# Patient Record
Sex: Female | Born: 1990
Health system: Southern US, Community
[De-identification: ages and names within clinical notes are randomized; demographics above are authoritative.]

## PROBLEM LIST (undated history)

## (undated) DIAGNOSIS — B3731 Acute candidiasis of vulva and vagina: Secondary | ICD-10-CM

## (undated) DIAGNOSIS — B9689 Other specified bacterial agents as the cause of diseases classified elsewhere: Secondary | ICD-10-CM

## (undated) DIAGNOSIS — K589 Irritable bowel syndrome without diarrhea: Secondary | ICD-10-CM

## (undated) DIAGNOSIS — B373 Candidiasis of vulva and vagina: Secondary | ICD-10-CM

## (undated) DIAGNOSIS — N760 Acute vaginitis: Secondary | ICD-10-CM

## (undated) DIAGNOSIS — Z227 Latent tuberculosis: Secondary | ICD-10-CM

## (undated) DIAGNOSIS — M419 Scoliosis, unspecified: Secondary | ICD-10-CM

## (undated) HISTORY — PX: FOOT SURGERY: SHX648

## (undated) HISTORY — DX: Irritable bowel syndrome, unspecified: K58.9

---
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
## 2010-11-09 IMAGING — CR DG CHEST 2V
2 series · 2 of 2 positions shown · non-contrast
Comparison: None

CLINICAL DATA: Nonsmoker.  Positive PPD test.

CHEST - 2 VIEW

[view not recorded (1 of 2)]
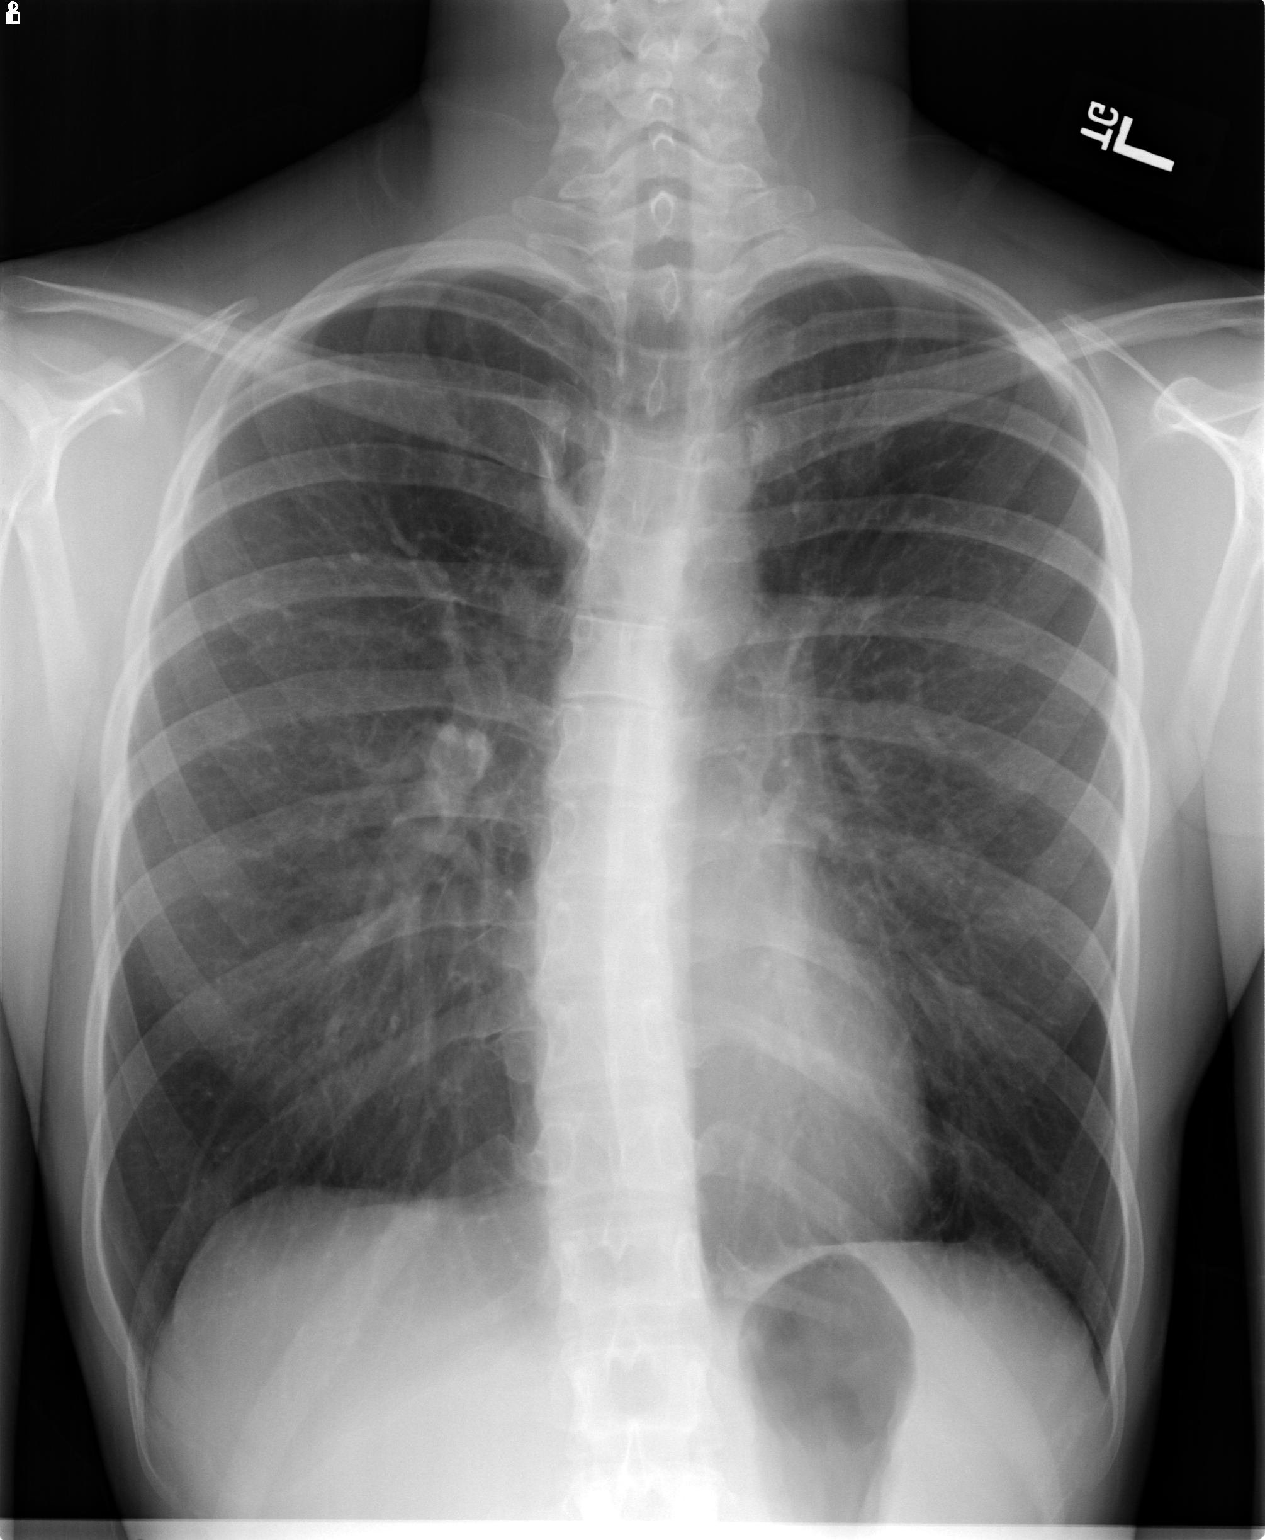

[view not recorded (2 of 2)]
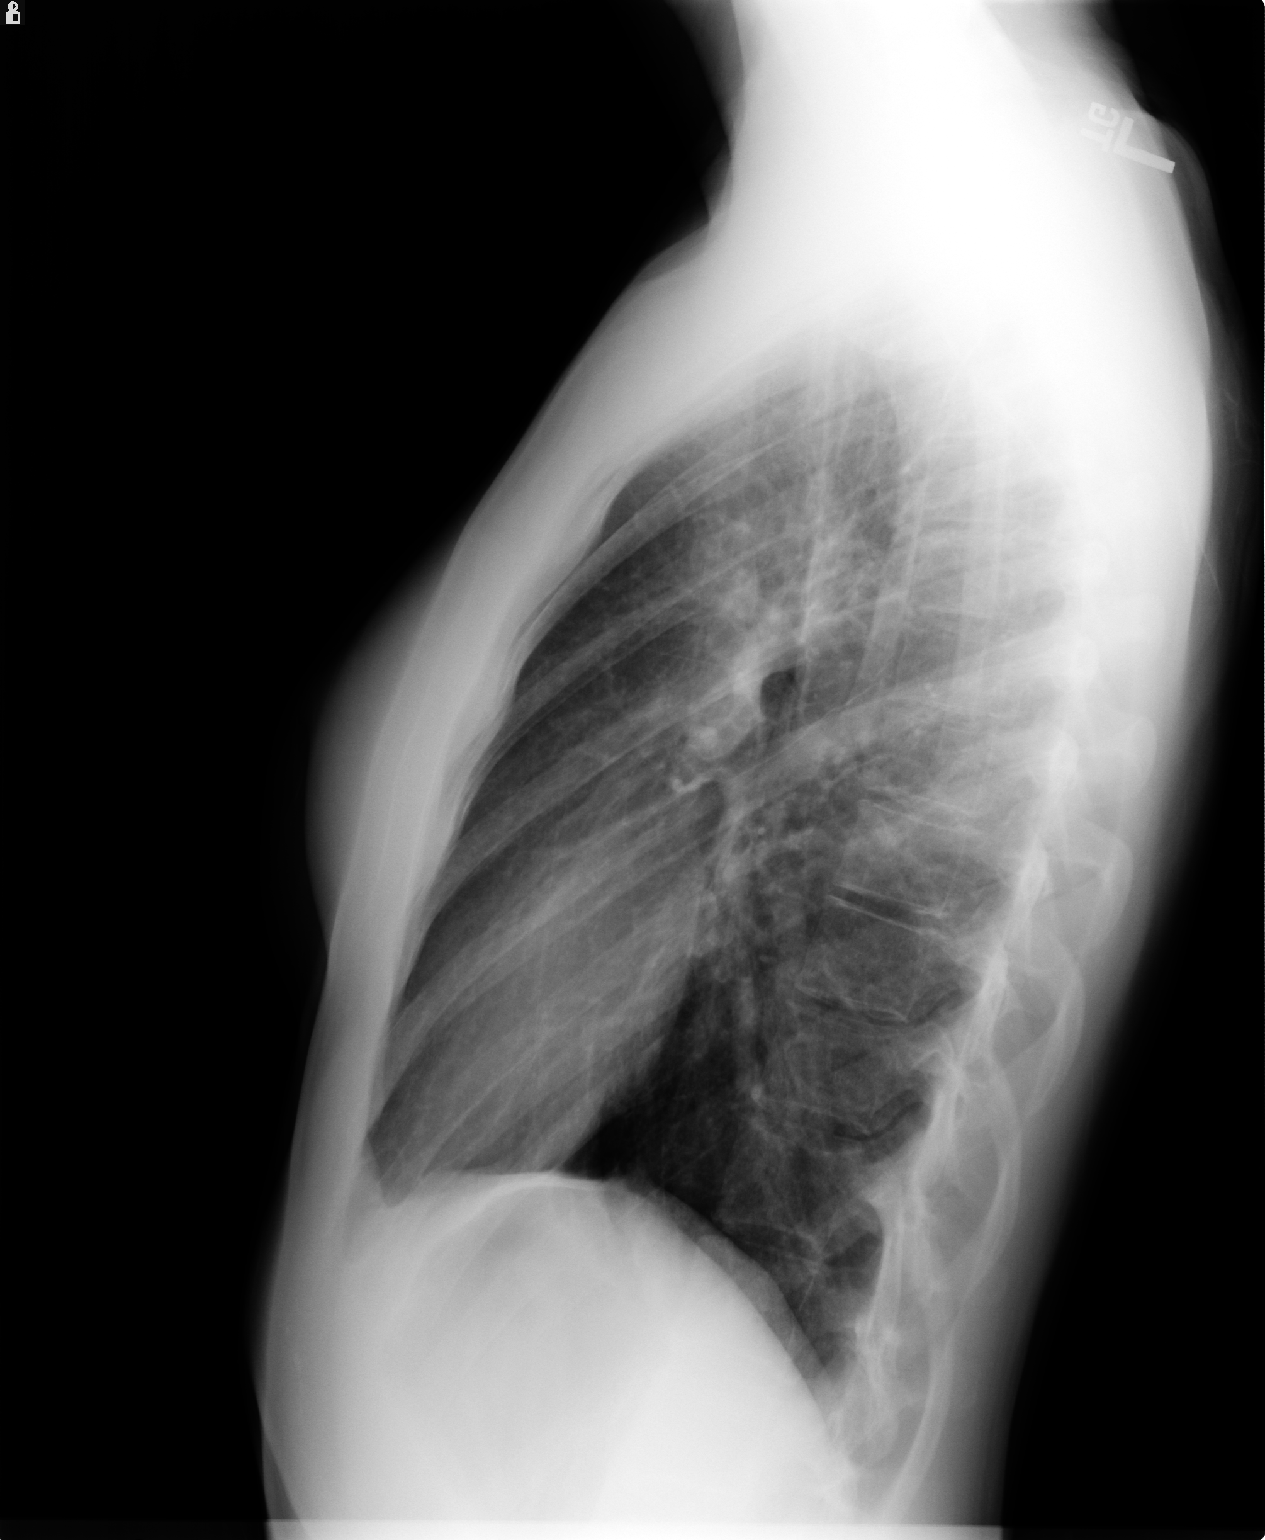

[2 of 2 positions shown; findings below may reference images not displayed]

FINDINGS: Mild to moderate S-shaped thoracolumbar spinal curvature.
Thoracic portion convex right. Midline trachea. Normal heart size
and mediastinal contours. No pleural effusion or pneumothorax.
Azygos fissure which is a normal anatomic variant. Clear lungs.
IMPRESSION: 1. No acute cardiopulmonary disease.
2.  Mild - moderate S-shaped thoracolumbar spinal curvature.

## 2010-12-19 LAB — LIPASE, BLOOD: Lipase: 23 U/L (ref 11–59)

## 2010-12-19 LAB — DIFFERENTIAL
Basophils Relative: 0 % (ref 0–1)
Eosinophils Absolute: 0.1 10*3/uL (ref 0.0–0.7)
Neutrophils Relative %: 56 % (ref 43–77)

## 2010-12-19 LAB — COMPREHENSIVE METABOLIC PANEL
ALT: 9 U/L (ref 0–35)
Alkaline Phosphatase: 47 U/L (ref 39–117)
CO2: 26 mEq/L (ref 19–32)
GFR calc non Af Amer: >60 mL/min (ref >60)
Glucose, Bld: 89 mg/dL (ref 70–99)
Potassium: 3.4 mEq/L — ABNORMAL LOW (ref 3.5–5.1)
Sodium: 137 mEq/L (ref 135–145)

## 2010-12-19 LAB — URINALYSIS, ROUTINE W REFLEX MICROSCOPIC
Bilirubin Urine: NEGATIVE
Glucose, UA: NEGATIVE mg/dL
Hgb urine dipstick: NEGATIVE
Ketones, ur: NEGATIVE mg/dL
Nitrite: NEGATIVE
Protein, ur: NEGATIVE mg/dL
Specific Gravity, Urine: 1.013 (ref 1.005–1.030)
Urobilinogen, UA: 0.2 mg/dL (ref 0.0–1.0)
pH: 7.5 (ref 5.0–8.0)

## 2010-12-19 LAB — CBC
HCT: 36.2 % (ref 36.0–46.0)
Hemoglobin: 12.9 g/dL (ref 12.0–15.0)
MCHC: 35.8 g/dL (ref 30.0–36.0)
MCV: 89.1 fL (ref 78.0–100.0)
Platelets: 200 10*3/uL (ref 150–400)
RBC: 4.06 MIL/uL (ref 3.87–5.11)
RDW: 12 % (ref 11.5–15.5)
WBC: 6.1 10*3/uL (ref 4.0–10.5)

## 2010-12-19 LAB — POCT PREGNANCY, URINE: Preg Test, Ur: NEGATIVE

## 2011-08-06 ENCOUNTER — Emergency Department (INDEPENDENT_AMBULATORY_CARE_PROVIDER_SITE_OTHER)
Admission: EM | Admit: 2011-08-06 | Discharge: 2011-08-06 | Disposition: A | Discharge status: Home / Self Care | Payer: Self-pay | Source: Home / Self Care | Attending: Emergency Medicine | Admitting: Emergency Medicine

## 2011-08-06 DIAGNOSIS — B373 Candidiasis of vulva and vagina: Secondary | ICD-10-CM

## 2011-08-06 DIAGNOSIS — R300 Dysuria: Secondary | ICD-10-CM

## 2011-08-06 DIAGNOSIS — B3731 Acute candidiasis of vulva and vagina: Secondary | ICD-10-CM

## 2011-08-06 HISTORY — DX: Candidiasis of vulva and vagina: B37.3

## 2011-08-06 HISTORY — DX: Acute candidiasis of vulva and vagina: B37.31

## 2011-08-06 LAB — POCT URINALYSIS DIP (DEVICE)
Glucose, UA: NEGATIVE mg/dL
Nitrite: NEGATIVE
Urobilinogen, UA: 0.2 mg/dL (ref 0.0–1.0)

## 2011-08-06 LAB — WET PREP, GENITAL: Yeast Wet Prep HPF POC: NONE SEEN

## 2011-08-06 LAB — POCT PREGNANCY, URINE: Preg Test, Ur: NEGATIVE

## 2011-08-06 MED ORDER — FLUCONAZOLE 150 MG PO TABS: ORAL_TABLET | ORAL | Status: AC

## 2011-08-06 NOTE — ED Provider Notes (Signed)
History     CSN: [id number] Arrival date & time: 08/06/2011 12:42 PM   First MD Initiated Contact with Patient 08/06/11 1214      Chief Complaint  Patient presents with  . Urinary Tract Infection    (Consider location/radiation/quality/duration/timing/severity/associated sxs/prior treatment) HPI Comments: Pt with 3 days nonoderous vaginal discharge, dysuria at end of stream. Was having some itching but used montistat vaginal suppositories with relief in itching. Dysuria started today.  No urgency, frequency, oderous urine, hematuria,  genital blisters. No fevers, N/V, abd pain, back pain. No recent abx use. Pt sexually active with same female partner who is asxatic. Had unprotected intercourse a week ago.  STD's not a concern today. H/o yeast infection. No h/o BV gonorrhea/chlamydia/trichmonoas. No h/o syphilis, herpes, HIV.    Patient is a 20 y.o. female presenting with dysuria. The history is provided by the patient.  Dysuria  This is a new problem. The current episode started more than 2 days ago. The problem occurs every urination. The problem has not changed since onset.The quality of the pain is described as burning. There has been no fever. There is no history of pyelonephritis. Associated symptoms include discharge. Pertinent negatives include no chills, no sweats, no nausea, no vomiting, no frequency, no hematuria, no hesitancy, no urgency and no flank pain.    Past Medical History  Diagnosis Date  . Vaginal yeast infection     History reviewed. No pertinent past surgical history.  History reviewed. No pertinent family history.  History  Substance Use Topics  . Smoking status: Never Smoker   . Smokeless tobacco: Not on file  . Alcohol Use: Yes    OB History    Grav Para Term Preterm Abortions TAB SAB Ect Mult Living                  Review of Systems  Constitutional: Negative for fever and chills.  Gastrointestinal: Negative for nausea, vomiting and abdominal  pain.  Genitourinary: Positive for dysuria and vaginal discharge. Negative for hesitancy, urgency, frequency, hematuria, flank pain, vaginal bleeding, genital sores and vaginal pain.  Musculoskeletal: Positive for back pain.  Skin: Negative for rash.    Allergies  Amoxapine and related; Penicillins; and Septra  Home Medications   Current Outpatient Rx  Name Route Sig Dispense Refill  . FLUCONAZOLE 150 MG PO TABS  1 tab po x 1. May repeat in 72 hours if no improvement 2 tablet 0    BP 104/56  Pulse 70  Temp(Src) 98.1 F (36.7 C) (Oral)  Resp 16  SpO2 100%  LMP 07/17/2011  Physical Exam  Nursing note and vitals reviewed. Constitutional: She is oriented to person, place, and time. She appears well-developed and well-nourished. No distress.  HENT:  Head: Normocephalic and atraumatic.  Eyes: EOM are normal. Pupils are equal, round, and reactive to light.  Neck: Normal range of motion.  Cardiovascular: Normal rate, regular rhythm and normal heart sounds.   Pulmonary/Chest: Effort normal and breath sounds normal.  Abdominal: Soft. Bowel sounds are normal. She exhibits no distension. There is no tenderness. There is no rebound, no guarding and no CVA tenderness.  Genitourinary: Uterus normal. Pelvic exam was performed with patient prone. There is no rash on the right labia. There is no rash on the left labia. Uterus is not tender. Cervix exhibits discharge. Cervix exhibits no motion tenderness and no friability. Right adnexum displays no mass, no tenderness and no fullness. Left adnexum displays no mass, no tenderness and  no fullness. There is erythema around the vagina. No tenderness or bleeding around the vagina. No foreign body around the vagina. Vaginal discharge found.       externeal vulvar irritation. Thick white nonoderous  vaginal d/c. Red inflamed cervix. Chaperone present during exam  Musculoskeletal: Normal range of motion.  Neurological: She is alert and oriented to person,  place, and time.  Skin: Skin is warm and dry.  Psychiatric: She has a normal mood and affect. Her behavior is normal. Judgment and thought content normal.    ED Course  Procedures (including critical care time)  Labs Reviewed  POCT URINALYSIS DIP (DEVICE) - Abnormal; Notable for the following:    Hgb urine dipstick SMALL (*)    Protein, ur 100 (*)    Leukocytes, UA SMALL (*) Biochemical Testing Only. Please order routine urinalysis from main lab if confirmatory testing is needed.   All other components within normal limits  POCT PREGNANCY, URINE  POCT PREGNANCY, URINE  WET PREP, GENITAL  GC/CHLAMYDIA PROBE AMP, GENITAL   No results found.   1. Vaginal yeast infection   2. Dysuria       MDM  H&P most c/w yeast infection. Sent off GC/chlamydia, wet prep.  Will not treat empirically now pt willing to wait for results. Will send home with diflucan. Advised pt to refrain from sexual contact until she knows lab results, symptoms resolve, and partner(s) are treated. Pt provided working phone number. Advised to call here in 5 days if pt does not hear from [place]. Pt agrees.      Luiz Blare, MD 08/06/11 [phone]

## 2011-08-06 NOTE — ED Notes (Signed)
C/o has pain w urination, worse at end of UA stream; also c/o vaginal d/c  Like cottage cheese ; used OTC vaginal treatment, w little relief; ?STD

## 2011-08-07 LAB — GC/CHLAMYDIA PROBE AMP, GENITAL
Chlamydia, DNA Probe: NEGATIVE
GC Probe Amp, Genital: NEGATIVE

## 2011-08-08 ENCOUNTER — Emergency Department (HOSPITAL_COMMUNITY)
Admission: EM | Admit: 2011-08-08 | Discharge: 2011-08-09 | Disposition: A | Discharge status: Home / Self Care | Payer: Self-pay | Attending: Emergency Medicine | Admitting: Emergency Medicine

## 2011-08-08 ENCOUNTER — Encounter (HOSPITAL_COMMUNITY): Payer: Self-pay | Admitting: *Deleted

## 2011-08-08 ENCOUNTER — Telehealth (HOSPITAL_COMMUNITY): Payer: Self-pay | Admitting: *Deleted

## 2011-08-08 DIAGNOSIS — B9689 Other specified bacterial agents as the cause of diseases classified elsewhere: Secondary | ICD-10-CM | POA: Insufficient documentation

## 2011-08-08 DIAGNOSIS — R300 Dysuria: Secondary | ICD-10-CM | POA: Insufficient documentation

## 2011-08-08 DIAGNOSIS — N760 Acute vaginitis: Secondary | ICD-10-CM | POA: Insufficient documentation

## 2011-08-08 DIAGNOSIS — A499 Bacterial infection, unspecified: Secondary | ICD-10-CM | POA: Insufficient documentation

## 2011-08-08 DIAGNOSIS — R10819 Abdominal tenderness, unspecified site: Secondary | ICD-10-CM | POA: Insufficient documentation

## 2011-08-08 HISTORY — DX: Other specified bacterial agents as the cause of diseases classified elsewhere: N76.0

## 2011-08-08 HISTORY — DX: Other specified bacterial agents as the cause of diseases classified elsewhere: B96.89

## 2011-08-08 NOTE — ED Notes (Signed)
Order obtained from  Dr. Chaney Malling for Flagyl 500 mg. BID x 7 days. Vassie Moselle 08/08/2011

## 2011-08-08 NOTE — ED Notes (Signed)
Pt c/o dysuria and hematuria w/ wiping after urination.  Pt d/x w/ BV and rx'd flagyl but has not started taking it yet. Pt c/o low back pain starting tonight.

## 2011-08-09 LAB — URINALYSIS, ROUTINE W REFLEX MICROSCOPIC
Specific Gravity, Urine: 1.01 (ref 1.005–1.030)
Urobilinogen, UA: 0.2 mg/dL (ref 0.0–1.0)

## 2011-08-09 LAB — URINE MICROSCOPIC-ADD ON

## 2011-08-09 LAB — POCT PREGNANCY, URINE: Preg Test, Ur: NEGATIVE

## 2011-08-09 MED ORDER — IBUPROFEN 800 MG PO TABS
800.0000 mg | ORAL_TABLET | Freq: Once | ORAL | Status: AC
  Administered 2011-08-09: 800 mg via ORAL
  Filled 2011-08-09: qty 1

## 2011-08-09 MED ORDER — OXYCODONE-ACETAMINOPHEN 5-325 MG PO TABS: 2.0000 | ORAL_TABLET | ORAL | Status: DC | PRN

## 2011-08-09 NOTE — ED Notes (Signed)
Lt lower back pain scale 5/10, medicated with motrin 800 mg po as ordered

## 2011-08-09 NOTE — ED Provider Notes (Signed)
History     CSN: [id number] Arrival date & time: 08/08/2011 11:10 PM   First MD Initiated Contact with Patient 08/08/11 2346      Chief Complaint  Patient presents with  . Hematuria  . Dysuria    (Consider location/radiation/quality/duration/timing/severity/associated sxs/prior treatment) Patient is a 20 y.o. female presenting with hematuria and dysuria. The history is provided by the patient. No language interpreter was used.  Hematuria This is a recurrent problem. The current episode started in the past 7 days. The problem is unchanged. Her pain is at a severity of 4/10. The pain is mild. She describes her urine color as light pink. Irritative symptoms do not include frequency or urgency. Obstructive symptoms do not include dribbling or a weak stream. Associated symptoms include dysuria and flank pain. Pertinent negatives include no abdominal pain, chills, fever, genital pain, hesitancy, inability to urinate, nausea or vomiting. There is no history of BPH, GU trauma, kidney stones, prostatitis, recent infection, sickle cell disease, STDs or tobacco use.  Dysuria  Associated symptoms include hematuria and flank pain. Pertinent negatives include no chills, no nausea, no vomiting, no frequency, no hesitancy and no urgency. Her past medical history does not include kidney stones.  Reports vaginal discharge x 3-4 days and was called today with a positive BV lab result from Urgent care when she was seen the day before.  She has not started her flagyl yet.  Now c/o dysuria and she saw some blood on the paper after wiping.  Also having L cva tenderness.  Has taken nothing for pain.  Does not like to take pain meds.    Past Medical History  Diagnosis Date  . Vaginal yeast infection   . BV (bacterial vaginosis)     History reviewed. No pertinent past surgical history.  History reviewed. No pertinent family history.  History  Substance Use Topics  . Smoking status: Current Everyday Smoker  -- 0.5 packs/day    Types: Cigarettes  . Smokeless tobacco: Not on file  . Alcohol Use: Yes    OB History    Grav Para Term Preterm Abortions TAB SAB Ect Mult Living                  Review of Systems  Constitutional: Negative for fever and chills.  Gastrointestinal: Negative for nausea, vomiting and abdominal pain.  Genitourinary: Positive for dysuria, hematuria and flank pain. Negative for hesitancy, urgency and frequency.  All other systems reviewed and are negative.    Allergies  Amoxapine and related; Penicillins; and Septra  Home Medications   Current Outpatient Rx  Name Route Sig Dispense Refill  . METRONIDAZOLE 500 MG PO TABS Oral Take 500 mg by mouth 2 (two) times daily. For seven days     . MICONAZOLE NITRATE 1200-2 MG-% VA KIT Vaginal Place 1 each vaginally once.      . OXYCODONE-ACETAMINOPHEN 5-325 MG PO TABS Oral Take 2 tablets by mouth every 4 (four) hours as needed for pain. 6 tablet 0    BP 109/58  Pulse 67  Temp(Src) 97.5 F (36.4 C) (Oral)  Resp 18  SpO2 100%  LMP 07/17/2011  Physical Exam  Nursing note and vitals reviewed. Constitutional: She is oriented to person, place, and time. She appears well-developed and well-nourished. She appears distressed.  Neck: Normal range of motion.  Cardiovascular: Normal rate.   Pulmonary/Chest: Effort normal and breath sounds normal. No respiratory distress.  Abdominal: Soft. There is CVA tenderness.  Musculoskeletal: Normal range of  motion.       L cva tenderness  Neurological: She is alert and oriented to person, place, and time.  Skin: Skin is warm and dry. She is not diaphoretic.  Psychiatric: She has a normal mood and affect.    ED Course  Procedures (including critical care time)  Labs Reviewed  URINALYSIS, ROUTINE W REFLEX MICROSCOPIC - Abnormal; Notable for the following:    APPearance CLOUDY (*)    Hgb urine dipstick LARGE (*)    Protein, ur 30 (*)    Leukocytes, UA MODERATE (*)    All other  components within normal limits  URINE MICROSCOPIC-ADD ON - Abnormal; Notable for the following:    Bacteria, UA FEW (*)    All other components within normal limits  POCT PREGNANCY, URINE  URINE CULTURE   No results found.   1. Bacterial vaginitis       MDM  Here after being diagnosed with BV from Urgent care.  Was supposed to start her flagyl today but did not.  C/O dysuria.  Delay in disposition because u/a was delayed almost 3 hours.  Urine negative for UTI but will culture and encourage to start flagyl asap.  Ibuprofen for pain.         Jethro Bastos, NP 08/09/11 1606

## 2011-08-09 NOTE — ED Notes (Signed)
Pt presented to rm 16 from triage c/o lt lower back pain associated with burning sensation on urination x 3 days, was seen @ [facility] 2 days ago and diagnosed with bacterial vaginosis as per pt, urine specimen collected in triage, pa on bedside, evaluation in progress

## 2011-08-09 NOTE — ED Notes (Signed)
Discharged  Ambulatory with discharge instructions and prescriptions, pt verbalized understanding discharge instructions, will continue prescribed meds, on stable condition at time of discharge, accompanied by mother

## 2011-08-10 NOTE — ED Provider Notes (Signed)
Medical screening examination/treatment/procedure(s) were performed by non-physician practitioner and as supervising physician I was immediately available for consultation/collaboration.  Cyndra Numbers, MD 08/10/11 [phone]

## 2011-08-12 LAB — URINE CULTURE
Colony Count: >=100000
Culture  Setup Time: 201212011140

## 2011-08-13 ENCOUNTER — Emergency Department (HOSPITAL_COMMUNITY)
Admission: EM | Admit: 2011-08-13 | Discharge: 2011-08-13 | Disposition: A | Discharge status: Home / Self Care | Payer: Self-pay | Attending: Emergency Medicine | Admitting: Emergency Medicine

## 2011-08-13 ENCOUNTER — Telehealth (HOSPITAL_COMMUNITY): Payer: Self-pay | Admitting: *Deleted

## 2011-08-13 ENCOUNTER — Encounter (HOSPITAL_COMMUNITY): Payer: Self-pay | Admitting: Emergency Medicine

## 2011-08-13 DIAGNOSIS — R109 Unspecified abdominal pain: Secondary | ICD-10-CM | POA: Insufficient documentation

## 2011-08-13 DIAGNOSIS — N390 Urinary tract infection, site not specified: Secondary | ICD-10-CM | POA: Insufficient documentation

## 2011-08-13 DIAGNOSIS — F17200 Nicotine dependence, unspecified, uncomplicated: Secondary | ICD-10-CM | POA: Insufficient documentation

## 2011-08-13 DIAGNOSIS — R300 Dysuria: Secondary | ICD-10-CM | POA: Insufficient documentation

## 2011-08-13 LAB — URINALYSIS, ROUTINE W REFLEX MICROSCOPIC
Ketones, ur: NEGATIVE mg/dL
Nitrite: NEGATIVE
Protein, ur: 100 mg/dL — AB
Urobilinogen, UA: 0.2 mg/dL (ref 0.0–1.0)
pH: 6 (ref 5.0–8.0)

## 2011-08-13 LAB — POCT PREGNANCY, URINE: Preg Test, Ur: NEGATIVE

## 2011-08-13 LAB — URINE MICROSCOPIC-ADD ON

## 2011-08-13 MED ORDER — PHENAZOPYRIDINE HCL 200 MG PO TABS: 200.0000 mg | ORAL_TABLET | Freq: Three times a day (TID) | ORAL | Status: AC

## 2011-08-13 MED ORDER — CIPROFLOXACIN HCL 500 MG PO TABS: 500.0000 mg | ORAL_TABLET | Freq: Two times a day (BID) | ORAL | Status: AC

## 2011-08-13 MED ORDER — CIPROFLOXACIN HCL 500 MG PO TABS
500.0000 mg | ORAL_TABLET | Freq: Once | ORAL | Status: AC
  Administered 2011-08-13: 500 mg via ORAL
  Filled 2011-08-13: qty 1

## 2011-08-13 NOTE — ED Notes (Signed)
Pt states she has been having urinary frequency and pain with urination.  She went to urgent care where they dx with a yeast infection.  She was treated.  2 days later, they called back and said it was BV.  Took 5 days of flagyl.  Was seen here last Friday night where her urine did not show UTI.  Is back today with increasing pain, increased frequency.

## 2011-08-13 NOTE — ED Provider Notes (Signed)
History     CSN: [id number] Arrival date & time: 08/13/2011  5:19 PM   First MD Initiated Contact with Patient 08/13/11 1802      Chief Complaint  Patient presents with  . Urinary Tract Infection    (Consider location/radiation/quality/duration/timing/severity/associated sxs/prior treatment) HPI Comments: Patient presents with 2 weeks of burning with urination.  She's been seen here twice in the last week and been diagnosed with bacterial vaginosis and yeast infection and treated for both of those without improvement.  Patient states she still has persistent urinary frequency and dysuria.  She denies any vaginal discharge.  No fevers.  She has some mild nausea but no vomiting.  She is now starting to get some left flank pain as well.  Patient is not pregnant  Patient is a 20 y.o. female presenting with urinary tract infection. The history is provided by the patient and a parent.  Urinary Tract Infection This is a new problem. The current episode started more than 1 week ago. The problem occurs constantly. The problem has been gradually worsening. Pertinent negatives include no chest pain, no abdominal pain, no headaches and no shortness of breath. The symptoms are relieved by nothing.    Past Medical History  Diagnosis Date  . Vaginal yeast infection   . BV (bacterial vaginosis)     History reviewed. No pertinent past surgical history.  No family history on file.  History  Substance Use Topics  . Smoking status: Current Everyday Smoker -- 0.5 packs/day    Types: Cigarettes  . Smokeless tobacco: Not on file  . Alcohol Use: Yes    OB History    Grav Para Term Preterm Abortions TAB SAB Ect Mult Living                  Review of Systems  Constitutional: Negative.  Negative for fever and chills.  HENT: Negative.   Eyes: Negative.  Negative for discharge and redness.  Respiratory: Negative.  Negative for cough and shortness of breath.   Cardiovascular: Negative.  Negative  for chest pain.  Gastrointestinal: Positive for nausea. Negative for vomiting, abdominal pain and diarrhea.  Genitourinary: Positive for dysuria, urgency and frequency. Negative for vaginal discharge.  Musculoskeletal: Positive for back pain.  Skin: Negative.  Negative for color change and rash.  Neurological: Negative.  Negative for syncope and headaches.  Hematological: Negative.  Negative for adenopathy.  Psychiatric/Behavioral: Negative.  Negative for confusion.  All other systems reviewed and are negative.    Allergies  Amoxicillin; Penicillins; and Septra  Home Medications   Current Outpatient Rx  Name Route Sig Dispense Refill  . IBUPROFEN 200 MG PO TABS Oral Take 200-400 mg by mouth every 6 (six) hours as needed. For pain.     Marland Kitchen METRONIDAZOLE 500 MG PO TABS Oral Take 500 mg by mouth 2 (two) times daily. 7 day course of therapy started on December 1st; not completed.    Marland Kitchen MICONAZOLE NITRATE 1200-2 MG-% VA KIT Vaginal Place 1 each vaginally once.      Marland Kitchen CIPROFLOXACIN HCL 500 MG PO TABS Oral Take 1 tablet (500 mg total) by mouth 2 (two) times daily. 14 tablet 0  . PHENAZOPYRIDINE HCL 200 MG PO TABS Oral Take 1 tablet (200 mg total) by mouth 3 (three) times daily. 6 tablet 0    BP 116/70  Pulse 98  Temp(Src) 98.3 F (36.8 C) (Oral)  Resp 17  SpO2 98%  LMP 07/17/2011  Physical Exam  Constitutional: She  is oriented to person, place, and time. She appears well-developed and well-nourished.  Non-toxic appearance. She does not have a sickly appearance.  HENT:  Head: Normocephalic and atraumatic.  Eyes: Conjunctivae, EOM and lids are normal. Pupils are equal, round, and reactive to light. No scleral icterus.  Neck: Trachea normal and normal range of motion. Neck supple.  Cardiovascular: Normal rate, regular rhythm and normal heart sounds.   Pulmonary/Chest: Effort normal and breath sounds normal.  Abdominal: Soft. Normal appearance. There is no tenderness. There is no rebound,  no guarding and no CVA tenderness.  Genitourinary:       Patient has left flank pain and costovertebral tenderness.  Musculoskeletal: Normal range of motion.  Neurological: She is alert and oriented to person, place, and time. She has normal strength.  Skin: Skin is warm, dry and intact. No rash noted.  Psychiatric: She has a normal mood and affect. Her behavior is normal. Judgment and thought content normal.    ED Course  Procedures (including critical care time)  Labs Reviewed  URINALYSIS, ROUTINE W REFLEX MICROSCOPIC - Abnormal; Notable for the following:    APPearance TURBID (*)    Hgb urine dipstick LARGE (*)    Protein, ur 100 (*)    Leukocytes, UA LARGE (*)    All other components within normal limits  POCT PREGNANCY, URINE  URINE MICROSCOPIC-ADD ON  POCT PREGNANCY, URINE  URINE CULTURE   No results found.   1. UTI (lower urinary tract infection)       MDM  Patient with 2 weeks of symptoms of urinary tract infection.  She does have flank pain now which is concerning for early pyelonephritis.  Patient was placed on one week of ciprofloxacin she also began a prescription for Pyridium.  She is artery had a pelvic exam in the last week that was negative for any gonorrhea Chlamydia or other sexually transmitted infections I do not feel she needs a repeat pelvic exam today.  Patient understands to return if she has nausea and vomiting and is not improving.        Nat Christen, MD 08/13/11 [phone]

## 2011-08-13 NOTE — ED Notes (Signed)
Pt reports worsening symptoms (burning, frequency, and back pain) while of Flagyl for BV--per pt and family, urine was lost during previous visit, then found, then results were unclear. But, pt continues to have worsening symptoms.

## 2011-08-14 NOTE — ED Notes (Signed)
+   urine Patient treated with Cipro-sensitive to same-chart appended per protocol MD. 

## 2011-08-16 LAB — URINE CULTURE

## 2011-08-17 NOTE — ED Notes (Signed)
+   Urine culture. Chart sent to EDP office for review 

## 2011-08-19 NOTE — ED Notes (Addendum)
Treatment is ok.Patient should continue Cipro per Pascal Lux Wingen.

## 2012-03-22 ENCOUNTER — Emergency Department (HOSPITAL_COMMUNITY)
Admission: EM | Admit: 2012-03-22 | Discharge: 2012-03-22 | Disposition: A | Discharge status: Home / Self Care | Payer: Self-pay | Attending: Emergency Medicine | Admitting: Emergency Medicine

## 2012-03-22 ENCOUNTER — Encounter (HOSPITAL_COMMUNITY): Payer: Self-pay | Admitting: *Deleted

## 2012-03-22 DIAGNOSIS — R1013 Epigastric pain: Secondary | ICD-10-CM | POA: Insufficient documentation

## 2012-03-22 DIAGNOSIS — R110 Nausea: Secondary | ICD-10-CM | POA: Insufficient documentation

## 2012-03-22 DIAGNOSIS — K2960 Other gastritis without bleeding: Secondary | ICD-10-CM | POA: Insufficient documentation

## 2012-03-22 DIAGNOSIS — Z79899 Other long term (current) drug therapy: Secondary | ICD-10-CM | POA: Insufficient documentation

## 2012-03-22 DIAGNOSIS — R10816 Epigastric abdominal tenderness: Secondary | ICD-10-CM | POA: Insufficient documentation

## 2012-03-22 DIAGNOSIS — T3995XA Adverse effect of unspecified nonopioid analgesic, antipyretic and antirheumatic, initial encounter: Secondary | ICD-10-CM | POA: Insufficient documentation

## 2012-03-22 LAB — COMPREHENSIVE METABOLIC PANEL
ALT: 25 U/L (ref 0–35)
Albumin: 3.8 g/dL (ref 3.5–5.2)
Alkaline Phosphatase: 63 U/L (ref 39–117)
GFR calc Af Amer: >90 mL/min (ref >90)
Glucose, Bld: 92 mg/dL (ref 70–99)
Potassium: 3.6 mEq/L (ref 3.5–5.1)
Sodium: 136 mEq/L (ref 135–145)
Total Protein: 7.8 g/dL (ref 6.0–8.3)

## 2012-03-22 LAB — URINALYSIS, ROUTINE W REFLEX MICROSCOPIC
Ketones, ur: NEGATIVE mg/dL
Leukocytes, UA: NEGATIVE
Nitrite: NEGATIVE
Specific Gravity, Urine: 1.018 (ref 1.005–1.030)
pH: 7 (ref 5.0–8.0)

## 2012-03-22 LAB — CBC WITH DIFFERENTIAL/PLATELET
Basophils Relative: 2 % — ABNORMAL HIGH (ref 0–1)
Eosinophils Absolute: 0 10*3/uL (ref 0.0–0.7)
Hemoglobin: 12.7 g/dL (ref 12.0–15.0)
Lymphocytes Relative: 54 % — ABNORMAL HIGH (ref 12–46)
MCHC: 35.2 g/dL (ref 30.0–36.0)
Neutrophils Relative %: 37 % — ABNORMAL LOW (ref 43–77)
RBC: 4.08 MIL/uL (ref 3.87–5.11)
WBC: 4.5 10*3/uL (ref 4.0–10.5)

## 2012-03-22 MED ORDER — ONDANSETRON 4 MG PO TBDP
4.0000 mg | ORAL_TABLET | Freq: Once | ORAL | Status: AC
  Administered 2012-03-22: 4 mg via ORAL
  Filled 2012-03-22: qty 1

## 2012-03-22 MED ORDER — TRAMADOL HCL 50 MG PO TABS: 50.0000 mg | ORAL_TABLET | Freq: Four times a day (QID) | ORAL | Status: AC | PRN

## 2012-03-22 MED ORDER — LANSOPRAZOLE 30 MG PO CPDR: 30.0000 mg | DELAYED_RELEASE_CAPSULE | Freq: Every day | ORAL | Status: DC

## 2012-03-22 MED ORDER — ONDANSETRON HCL 4 MG PO TABS: 4.0000 mg | ORAL_TABLET | Freq: Four times a day (QID) | ORAL | Status: AC

## 2012-03-22 MED ORDER — PANTOPRAZOLE SODIUM 40 MG PO TBEC
40.0000 mg | DELAYED_RELEASE_TABLET | Freq: Once | ORAL | Status: AC
  Administered 2012-03-22: 40 mg via ORAL
  Filled 2012-03-22: qty 1

## 2012-03-22 MED ORDER — GI COCKTAIL ~~LOC~~
30.0000 mL | Freq: Once | ORAL | Status: AC
  Administered 2012-03-22: 30 mL via ORAL
  Filled 2012-03-22: qty 30

## 2012-03-22 NOTE — ED Provider Notes (Signed)
History     CSN: [id number]  Arrival date & time 03/22/12  1358   First MD Initiated Contact with Patient 03/22/12 1508      Chief Complaint  Patient presents with  . Nausea  . Abdominal Pain    (Consider location/radiation/quality/duration/timing/severity/associated sxs/prior treatment) HPI Pt states that she has had epigastric abd pain, worse after eating x 1 week. + occasional nausea. No vomiting, fever, diarrhea or melena. No prev abd surgeries. Normal period. No urinary complaints. Pt admits to 1 week prior to abd symptoms starting taking 400 mg of ibuprofen every 4-6 hours for 7 days for her HA. She stopped taking this when her abd symptoms started. She took a PPI from her roommate with some improvement of symptoms Past Medical History  Diagnosis Date  . Vaginal yeast infection   . BV (bacterial vaginosis)     History reviewed. No pertinent past surgical history.  No family history on file.  History  Substance Use Topics  . Smoking status: Former Smoker    Types: Cigarettes  . Smokeless tobacco: Not on file  . Alcohol Use: Yes     rarely    OB History    Grav Para Term Preterm Abortions TAB SAB Ect Mult Living                  Review of Systems  Constitutional: Negative for fever and chills.  Gastrointestinal: Positive for nausea and abdominal pain. Negative for vomiting, diarrhea, constipation, blood in stool and anal bleeding.  Genitourinary: Negative for dysuria, flank pain, vaginal bleeding, vaginal discharge and pelvic pain.  Musculoskeletal: Negative for back pain.  Skin: Negative for rash and wound.  Neurological: Negative for dizziness, weakness, light-headedness and numbness.    Allergies  Amoxicillin; Penicillins; and Septra  Home Medications   Current Outpatient Rx  Name Route Sig Dispense Refill  . DESOGESTREL-ETHINYL ESTRADIOL 0.15-30 MG-MCG PO TABS Oral Take 1 tablet by mouth daily.    . IBUPROFEN 200 MG PO TABS Oral Take 200-400 mg by  mouth every 6 (six) hours as needed. For pain.     Marland Kitchen LANSOPRAZOLE 30 MG PO CPDR Oral Take 1 capsule (30 mg total) by mouth daily. 30 capsule 0  . ONDANSETRON HCL 4 MG PO TABS Oral Take 1 tablet (4 mg total) by mouth every 6 (six) hours. 12 tablet 0  . TRAMADOL HCL 50 MG PO TABS Oral Take 1 tablet (50 mg total) by mouth every 6 (six) hours as needed for pain. 15 tablet 0    BP 114/77  Pulse 71  Temp 98 F (36.7 C) (Oral)  Resp 20  Wt 127 lb (57.607 kg)  SpO2 100%  LMP 03/20/2012  Physical Exam  Nursing note and vitals reviewed. Constitutional: She is oriented to person, place, and time. She appears well-developed and well-nourished. No distress.  HENT:  Head: Normocephalic and atraumatic.  Mouth/Throat: Oropharynx is clear and moist.  Eyes: EOM are normal. Pupils are equal, round, and reactive to light.  Neck: Normal range of motion. Neck supple.  Cardiovascular: Normal rate and regular rhythm.   Pulmonary/Chest: Effort normal and breath sounds normal. No respiratory distress. She has no wheezes. She has no rales.  Abdominal: Soft. Bowel sounds are normal. She exhibits no distension and no mass. There is tenderness (TTP over epigastrum. No masses rebound or guarding). There is no rebound and no guarding.  Musculoskeletal: Normal range of motion. She exhibits no edema and no tenderness.  Neurological: She is  alert and oriented to person, place, and time.  Skin: Skin is warm and dry. No rash noted. No erythema.  Psychiatric: She has a normal mood and affect. Her behavior is normal.    ED Course  Procedures (including critical care time)  Labs Reviewed  CBC WITH DIFFERENTIAL - Abnormal; Notable for the following:    Platelets 144 (*)     Neutrophils Relative 37 (*)     Lymphocytes Relative 54 (*)     Basophils Relative 2 (*)     All other components within normal limits  URINALYSIS, ROUTINE W REFLEX MICROSCOPIC  COMPREHENSIVE METABOLIC PANEL  LIPASE, BLOOD   No results  found.   1. NSAID induced gastritis       MDM  Likely NSAID induced gastritis. Will r/o pancreatitis and treat symptomatically.      Pt's symptoms have improved. F/u discussed. Advised to avoid NSAID's  Loren Racer, MD 03/22/12 1719

## 2012-03-22 NOTE — ED Notes (Signed)
Pt states "I've been sick for about a week, been taking a lot of acetaminophen, my stomach hurts, having diarrhea, nausea but no vomiting"

## 2012-03-22 NOTE — ED Notes (Signed)
Pt. Reports sharp mid abdominal pain for one week.  Pt reports she has been taking quite a bit of Advil for headaches.  Only able to eat one meal per day.  Occasional nausea diarrhea stool today.

## 2012-07-01 ENCOUNTER — Emergency Department (INDEPENDENT_AMBULATORY_CARE_PROVIDER_SITE_OTHER)
Admission: EM | Admit: 2012-07-01 | Discharge: 2012-07-01 | Disposition: A | Discharge status: Home / Self Care | Payer: Self-pay | Source: Home / Self Care | Attending: Emergency Medicine | Admitting: Emergency Medicine

## 2012-07-01 ENCOUNTER — Emergency Department (INDEPENDENT_AMBULATORY_CARE_PROVIDER_SITE_OTHER): Payer: Self-pay

## 2012-07-01 ENCOUNTER — Encounter (HOSPITAL_COMMUNITY): Payer: Self-pay

## 2012-07-01 DIAGNOSIS — R05 Cough: Secondary | ICD-10-CM

## 2012-07-01 DIAGNOSIS — R059 Cough, unspecified: Secondary | ICD-10-CM

## 2012-07-01 DIAGNOSIS — J40 Bronchitis, not specified as acute or chronic: Secondary | ICD-10-CM

## 2012-07-01 HISTORY — DX: Latent tuberculosis: Z22.7

## 2012-07-01 MED ORDER — METHYLPREDNISOLONE 4 MG PO KIT: PACK | ORAL | Status: DC

## 2012-07-01 MED ORDER — ALBUTEROL SULFATE HFA 108 (90 BASE) MCG/ACT IN AERS: 2.0000 | INHALATION_SPRAY | RESPIRATORY_TRACT | Status: DC | PRN

## 2012-07-01 MED ORDER — GUAIFENESIN-CODEINE 100-10 MG/5ML PO SYRP: 5.0000 mL | ORAL_SOLUTION | Freq: Three times a day (TID) | ORAL | Status: DC | PRN

## 2012-07-01 NOTE — ED Notes (Signed)
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

## 2012-07-01 NOTE — ED Provider Notes (Signed)
History     CSN: [id number]  Arrival date & time 07/01/12  1654   First MD Initiated Contact with Patient 07/01/12 1738      Chief Complaint  Patient presents with  . Cough    (Consider location/radiation/quality/duration/timing/severity/associated sxs/prior treatment) Patient is a 21 y.o. female presenting with cough. The history is provided by the patient.  Cough This is a recurrent problem. The current episode started more than 1 week ago. The problem occurs every few hours. The problem has been gradually improving. The cough is non-productive. There has been no fever. Associated symptoms include wheezing. Pertinent negatives include no chest pain, no chills, no sweats, no ear congestion, no ear pain, no headaches, no rhinorrhea, no sore throat, no myalgias, no shortness of breath and no eye redness. She has tried decongestants and cough syrup for the symptoms. The treatment provided mild relief. She is not a smoker (former smoker). Her past medical history does not include bronchitis, pneumonia, bronchiectasis, COPD, emphysema or asthma.  Cough cold symptoms that began two months ago.  Cold symptoms resolved, states cough worse at night with noted wheezing.  Has known history of allergies, reports dying cat at home that she has been spending more time around.  Expresses concern related to hx of +TB skin test along with treatment with inozaid.  Reports not finishing treatment as prescribed related to an unrelated allergic incident in 2009.   Request chest xray for follow up. Past Medical History  Diagnosis Date  . Vaginal yeast infection   . BV (bacterial vaginosis)   . TB lung, latent     History reviewed. No pertinent past surgical history.  History reviewed. No pertinent family history.  History  Substance Use Topics  . Smoking status: Former Smoker    Types: Cigarettes  . Smokeless tobacco: Not on file  . Alcohol Use: Yes     rarely    OB History    Grav Para Term  Preterm Abortions TAB SAB Ect Mult Living                  Review of Systems  Constitutional: Negative.  Negative for chills.  HENT: Negative.  Negative for ear pain, sore throat and rhinorrhea.   Eyes: Negative for redness.  Respiratory: Positive for cough and wheezing. Negative for choking, chest tightness, shortness of breath and stridor.   Cardiovascular: Negative.  Negative for chest pain.  Musculoskeletal: Negative for myalgias.  Neurological: Negative for headaches.    Allergies  Amoxicillin; Penicillins; and Septra  Home Medications   Current Outpatient Rx  Name Route Sig Dispense Refill  . ALBUTEROL SULFATE HFA 108 (90 BASE) MCG/ACT IN AERS Inhalation Inhale 2 puffs into the lungs every 4 (four) hours as needed for wheezing. 1 Inhaler 1  . DESOGESTREL-ETHINYL ESTRADIOL 0.15-30 MG-MCG PO TABS Oral Take 1 tablet by mouth daily.    . GUAIFENESIN-CODEINE 100-10 MG/5ML PO SYRP Oral Take 5 mLs by mouth 3 (three) times daily as needed for cough. 120 mL 0  . IBUPROFEN 200 MG PO TABS Oral Take 200-400 mg by mouth every 6 (six) hours as needed. For pain.     Marland Kitchen LANSOPRAZOLE 30 MG PO CPDR Oral Take 1 capsule (30 mg total) by mouth daily. 30 capsule 0  . METHYLPREDNISOLONE 4 MG PO KIT  Take as directed 21 tablet 0    BP 138/86  Pulse 91  Temp 98.7 F (37.1 C) (Oral)  Resp 20  SpO2 100%  LMP 06/17/2012  Physical Exam  Nursing note and vitals reviewed. Constitutional: She is oriented to person, place, and time. Vital signs are normal. She appears well-developed and well-nourished. She is active and cooperative.  HENT:  Head: Normocephalic.  Right Ear: External ear normal.  Left Ear: External ear normal.  Nose: Nose normal.  Mouth/Throat: Oropharynx is clear and moist. No oropharyngeal exudate.  Eyes: Conjunctivae normal and EOM are normal. Pupils are equal, round, and reactive to light. No scleral icterus.  Neck: Trachea normal and normal range of motion. Neck supple. No  thyromegaly present.  Cardiovascular: Normal rate, regular rhythm, normal heart sounds and intact distal pulses.   No murmur heard. Pulmonary/Chest: Effort normal and breath sounds normal.  Musculoskeletal: Normal range of motion.  Lymphadenopathy:    She has no cervical adenopathy.  Neurological: She is alert and oriented to person, place, and time. No cranial nerve deficit or sensory deficit.  Skin: Skin is warm and dry. No rash noted.  Psychiatric: She has a normal mood and affect. Her speech is normal and behavior is normal. Judgment and thought content normal. Cognition and memory are normal.    ED Course  Procedures (including critical care time)  Labs Reviewed - No data to display Dg Chest 2 View  07/01/2012  *RADIOLOGY REPORT*  Clinical Data: Cough  CHEST - 2 VIEW  Comparison: 10/15/2010  Findings: Unchanged cardiac silhouette and mediastinal contours. Incidental note is made of an azygos fissure.  Mild diffuse thickening of the pulmonary interstitium.  No focal parenchymal opacities.  No pleural effusion or pneumothorax.  Unchanged bones.  IMPRESSION: Findings suggestive of airways disease.  No focal airspace opacities to suggest pneumonia.   Original Report Authenticated By: Waynard Reeds, M.D.      1. Bronchitis   2. Cough       MDM  Increase fluids.  Medications as prescribed.  Follow up with pcp as needed       Johnsie Kindred, NP 07/01/12 1837

## 2012-07-01 NOTE — ED Provider Notes (Signed)
Medical screening examination/treatment/procedure(s) were performed by non-physician practitioner and as supervising physician I was immediately available for consultation/collaboration.  Acheron Sugg   Mihaela Fajardo, MD 07/01/12 1849 

## 2013-06-17 ENCOUNTER — Emergency Department (HOSPITAL_BASED_OUTPATIENT_CLINIC_OR_DEPARTMENT_OTHER): Payer: 59

## 2013-06-17 ENCOUNTER — Encounter (HOSPITAL_BASED_OUTPATIENT_CLINIC_OR_DEPARTMENT_OTHER): Payer: Self-pay | Admitting: Emergency Medicine

## 2013-06-17 ENCOUNTER — Emergency Department (HOSPITAL_BASED_OUTPATIENT_CLINIC_OR_DEPARTMENT_OTHER)
Admission: EM | Admit: 2013-06-17 | Discharge: 2013-06-17 | Disposition: A | Discharge status: Home / Self Care | Payer: 59 | Attending: Emergency Medicine | Admitting: Emergency Medicine

## 2013-06-17 DIAGNOSIS — Z87891 Personal history of nicotine dependence: Secondary | ICD-10-CM | POA: Insufficient documentation

## 2013-06-17 DIAGNOSIS — S161XXA Strain of muscle, fascia and tendon at neck level, initial encounter: Secondary | ICD-10-CM

## 2013-06-17 DIAGNOSIS — Y92410 Unspecified street and highway as the place of occurrence of the external cause: Secondary | ICD-10-CM | POA: Insufficient documentation

## 2013-06-17 DIAGNOSIS — S7000XA Contusion of unspecified hip, initial encounter: Secondary | ICD-10-CM | POA: Insufficient documentation

## 2013-06-17 DIAGNOSIS — S301XXA Contusion of abdominal wall, initial encounter: Secondary | ICD-10-CM

## 2013-06-17 DIAGNOSIS — Z8611 Personal history of tuberculosis: Secondary | ICD-10-CM | POA: Insufficient documentation

## 2013-06-17 DIAGNOSIS — Z3202 Encounter for pregnancy test, result negative: Secondary | ICD-10-CM | POA: Insufficient documentation

## 2013-06-17 DIAGNOSIS — S0990XA Unspecified injury of head, initial encounter: Secondary | ICD-10-CM

## 2013-06-17 DIAGNOSIS — Z79899 Other long term (current) drug therapy: Secondary | ICD-10-CM | POA: Insufficient documentation

## 2013-06-17 DIAGNOSIS — Z8739 Personal history of other diseases of the musculoskeletal system and connective tissue: Secondary | ICD-10-CM | POA: Insufficient documentation

## 2013-06-17 DIAGNOSIS — S139XXA Sprain of joints and ligaments of unspecified parts of neck, initial encounter: Secondary | ICD-10-CM | POA: Insufficient documentation

## 2013-06-17 DIAGNOSIS — S7001XA Contusion of right hip, initial encounter: Secondary | ICD-10-CM

## 2013-06-17 DIAGNOSIS — Z8742 Personal history of other diseases of the female genital tract: Secondary | ICD-10-CM | POA: Insufficient documentation

## 2013-06-17 DIAGNOSIS — Y9389 Activity, other specified: Secondary | ICD-10-CM | POA: Insufficient documentation

## 2013-06-17 DIAGNOSIS — Z880 Allergy status to penicillin: Secondary | ICD-10-CM | POA: Insufficient documentation

## 2013-06-17 DIAGNOSIS — IMO0002 Reserved for concepts with insufficient information to code with codable children: Secondary | ICD-10-CM | POA: Insufficient documentation

## 2013-06-17 HISTORY — DX: Scoliosis, unspecified: M41.9

## 2013-06-17 LAB — BASIC METABOLIC PANEL
BUN: 14 mg/dL (ref 6–23)
CO2: 23 mEq/L (ref 19–32)
Chloride: 102 mEq/L (ref 96–112)
Glucose, Bld: 96 mg/dL (ref 70–99)
Potassium: 3.3 mEq/L — ABNORMAL LOW (ref 3.5–5.1)

## 2013-06-17 MED ORDER — IOHEXOL 300 MG/ML  SOLN
100.0000 mL | Freq: Once | INTRAMUSCULAR | Status: AC | PRN
  Administered 2013-06-17: 100 mL via INTRAVENOUS

## 2013-06-17 MED ORDER — MORPHINE SULFATE 4 MG/ML IJ SOLN
4.0000 mg | Freq: Once | INTRAMUSCULAR | Status: AC
  Administered 2013-06-17: 4 mg via INTRAVENOUS
  Filled 2013-06-17: qty 1

## 2013-06-17 MED ORDER — HYDROCODONE-ACETAMINOPHEN 5-325 MG PO TABS: 2.0000 | ORAL_TABLET | ORAL | Status: DC | PRN

## 2013-06-17 MED ORDER — ONDANSETRON HCL 4 MG/2ML IJ SOLN
4.0000 mg | Freq: Once | INTRAMUSCULAR | Status: AC
  Administered 2013-06-17: 4 mg via INTRAVENOUS
  Filled 2013-06-17: qty 2
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
## 2013-06-17 NOTE — ED Notes (Signed)
Pt remains in radiology 

## 2013-06-17 NOTE — ED Notes (Signed)
Patient transported to X-ray and CT via stretcher per tech. 
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
## 2013-06-17 NOTE — ED Notes (Signed)
PA at bedside.

## 2013-06-17 NOTE — ED Notes (Signed)
Patient is resting comfortably.  Denies need for pain med or other intervention

## 2016-09-09 ENCOUNTER — Encounter (HOSPITAL_COMMUNITY): Payer: Self-pay

## 2016-09-09 ENCOUNTER — Emergency Department (HOSPITAL_COMMUNITY)
Admission: EM | Admit: 2016-09-09 | Discharge: 2016-09-09 | Disposition: A | Discharge status: Home / Self Care | Payer: 59 | Attending: Emergency Medicine | Admitting: Emergency Medicine

## 2016-09-09 DIAGNOSIS — H1032 Unspecified acute conjunctivitis, left eye: Secondary | ICD-10-CM | POA: Insufficient documentation

## 2016-09-09 DIAGNOSIS — Z87891 Personal history of nicotine dependence: Secondary | ICD-10-CM | POA: Diagnosis not present

## 2016-09-09 DIAGNOSIS — H578 Other specified disorders of eye and adnexa: Secondary | ICD-10-CM | POA: Diagnosis present

## 2016-09-09 MED ORDER — FLUORESCEIN SODIUM 0.6 MG OP STRP
1.0000 | ORAL_STRIP | Freq: Once | OPHTHALMIC | Status: DC
  Filled 2016-09-09: qty 1

## 2016-09-09 MED ORDER — KETOROLAC TROMETHAMINE 0.5 % OP SOLN: 1.0000 [drp] | Freq: Four times a day (QID) | OPHTHALMIC | 0 refills | Status: DC

## 2016-09-09 MED ORDER — GATIFLOXACIN 0.5 % OP SOLN: 1.0000 [drp] | Freq: Four times a day (QID) | OPHTHALMIC | 0 refills | Status: DC

## 2016-09-09 MED ORDER — TETRACAINE HCL 0.5 % OP SOLN
2.0000 [drp] | Freq: Once | OPHTHALMIC | Status: DC
  Filled 2016-09-09: qty 2

## 2016-09-09 NOTE — ED Provider Notes (Signed)
[facility]-EMERGENCY DEPT Provider Note   CSN: [id number] Arrival date & time: 09/09/16  4098  By signing my name below, I, Soijett Blue, attest that this documentation has been prepared under the direction and in the presence of Lorretta Harp, PA-C Electronically Signed: Soijett Blue, ED Scribe. 09/09/16. 11:04 AM.  History   Chief Complaint Chief Complaint  Patient presents with  . Foreign Body in Eye    HPI Bailey [name] is a 26 y.o. female who presents to the Emergency Department complaining of FB sensation to left eye onset last night. Pt notes that she was rolling on the floor with her son, when she felt a FB sensation to her left eye. Pt also states that her son was recently diagnosed with pink eye. She is having associated symptoms of non-radiating left eye pain, swelling to left upper eyelid, pressure sensation to left eye, and left eye discharge. Pt rates her left eye pain as 3-4/10 with her eye closed and 6-7/10 with her left eye open. She has tried eye flushes and saline drops without medications for the relief of his symptoms. She denies vision change, photophobia, fever, chills, and any other symptoms. Denies wearing contact lenses or glasses. Pt denies having an eye doctor at this time.   The history is provided by the patient. No language interpreter was used.    Past Medical History:  Diagnosis Date  . BV (bacterial vaginosis)   . Scoliosis   . TB lung, latent   . Vaginal yeast infection     There are no active problems to display for this patient.   History reviewed. No pertinent surgical history.  OB History    No data available       Home Medications    Prior to Admission medications   Medication Sig Start Date End Date Taking? Authorizing Provider  albuterol (PROVENTIL HFA;VENTOLIN HFA) 108 (90 BASE) MCG/ACT inhaler Inhale 2 puffs into the lungs every 4 (four) hours as needed for wheezing. 07/01/12   Johnsie Kindred, NP  desogestrel-ethinyl estradiol  (APRI,EMOQUETTE,SOLIA) 0.15-30 MG-MCG tablet Take 1 tablet by mouth daily.    Historical Provider, MD  gatifloxacin (ZYMAXID) 0.5 % SOLN Place 1 drop into both eyes 4 (four) times daily. 1-2 drops every 2 hours while awake for 2 days, then every 4-8 hours for 5 days. 09/09/16   Tysheem Accardo Manuel [place], [place]  guaiFENesin-codeine (ROBITUSSIN AC) 100-10 MG/5ML syrup Take 5 mLs by mouth 3 (three) times daily as needed for cough. 07/01/12   Johnsie Kindred, NP  HYDROcodone-acetaminophen (NORCO) 5-325 MG per tablet Take 2 tablets by mouth every 4 (four) hours as needed for pain. 06/17/13   Geoffery Lyons, MD  ibuprofen (ADVIL,MOTRIN) 200 MG tablet Take 200-400 mg by mouth every 6 (six) hours as needed. For pain.     Historical Provider, MD  ketorolac (ACULAR) 0.5 % ophthalmic solution Place 1 drop into the left eye every 6 (six) hours. 09/09/16   Keyosha Tiedt Manuel [place], [place]  lansoprazole (PREVACID) 30 MG capsule Take 1 capsule (30 mg total) by mouth daily. 03/22/12 03/22/13  Loren Racer, MD  methylPREDNISolone (MEDROL DOSEPAK) 4 MG tablet Take as directed 07/01/12   Johnsie Kindred, NP    Family History No family history on file.  Social History Social History  Substance Use Topics  . Smoking status: Former Smoker    Types: Cigarettes  . Smokeless tobacco: Never Used  . Alcohol use Yes     Comment: rarely  Allergies   Amoxicillin; Penicillins; and Septra [bactrim]   Review of Systems Review of Systems  Constitutional: Negative for chills and fever.  Eyes: Positive for pain (left) and discharge (left). Negative for visual disturbance.       +FB sensation to left eye +Pressure sensation to left eye +Swelling to left upper eyelid    Physical Exam Updated Vital Signs BP 116/72 (BP Location: Left Arm)   Pulse 68   Temp 98.2 F (36.8 C) (Oral)   Resp 14   Ht 5\' 10"  (1.778 m)   Wt 59 kg   LMP 09/02/2016 (Within Days)   SpO2 100%   BMI 18.65 kg/m   Physical Exam  Constitutional:  She is oriented to person, place, and time. She appears well-developed and well-nourished. No distress.  HENT:  Head: Normocephalic and atraumatic.  Eyes: EOM are normal. Lids are everted and swept, no foreign bodies found. No foreign body present in the left eye. Left conjunctiva is injected.  Slit lamp exam:      The left eye shows no corneal abrasion, no corneal ulcer, no foreign body and no fluorescein uptake.  Mild swelling noted to left upper eyelid. Tonopen pressure to left eye is 11.                                 Visual Acuity  Right Eye Distance: 20/20 without corrective lens Left Eye Distance: 20/20 without corrective lens Bilateral Distance: 20/20 without corrective lens   Neck: Neck supple.  Cardiovascular: Normal rate.   Pulmonary/Chest: Effort normal. No respiratory distress.  Abdominal: She exhibits no distension.  Musculoskeletal: Normal range of motion.  Neurological: She is alert and oriented to person, place, and time.  Skin: Skin is warm and dry.  Psychiatric: She has a normal mood and affect. Her behavior is normal.  Nursing note and vitals reviewed.     DIAGNOSTIC STUDIES: Oxygen Saturation is 100% on RA, nl by my interpretation.    COORDINATION OF CARE: 11:00 AM Discussed treatment plan with pt at bedside which includes visual acuity, Ketoralac eye drops, gatifloxacin drops, and pt agreed to plan.  Procedures Procedures (including critical care time)  Medications Ordered in ED Medications - No data to display   Initial Impression / Assessment and Plan / ED Course  I have reviewed the triage vital signs and the nursing notes.   Clinical Course    Patient presentation consistent with conjunctivitis. Visual acuity 20/20 each eye individually and bilaterally with no corrective lenses.  No corneal abrasions, entrapment, consensual photophobia, or dendritic staining with fluorescein study.  Presentation non-concerning for iritis, corneal abrasions, or  HSV. Tonometer measured 11, so symptoms of vision loss or classic "halos," non-concerning for glaucoma. Antibiotic drops and analgesic drops will be prescribed due to mild discharge, pain and sick contacts at home.  Personal hygiene and frequent handwashing discussed.  Patient advised to followup with ophthalmologist if symptoms persist or worsen in any way including vision change.  Patient verbalizes understanding and is agreeable with discharge.   Final Clinical Impressions(s) / ED Diagnoses   Final diagnoses:  Acute conjunctivitis of left eye, unspecified acute conjunctivitis type    New Prescriptions Discharge Medication List as of 09/09/2016 11:18 AM    START taking these medications   Details  gatifloxacin (ZYMAXID) 0.5 % SOLN Place 1 drop into both eyes 4 (four) times daily. 1-2 drops every 2 hours while awake for  2 days, then every 4-8 hours for 5 days., Starting Tue 09/09/2016, Print    ketorolac (ACULAR) 0.5 % ophthalmic solution Place 1 drop into the left eye every 6 (six) hours., Starting Tue 09/09/2016, Print       I personally performed the services described in this documentation, which was scribed in my presence. The recorded information has been reviewed and is accurate.     [address]Risha Barretta [place], [place] 09/09/16 2022    Jacalyn LefevreJulie Haviland, MD 09/11/16 [phone]

## 2016-09-09 NOTE — ED Triage Notes (Signed)
Pt presents for evaluation of possible foreign body to L eye. Pt reports she was playing on floor with son last night when she felt like something was in her eye. Pt reports attempting to flush eye with no improvement. Pt states unable to see anything in eye. Pt AxO x4, ambulatory in triage.

## 2016-09-09 NOTE — Discharge Instructions (Signed)
Please take gatifloxacin 1-2 drop every 2 hours while awake for 2 days, then every 4-8 hours for 5 days to both eyes. Please take ketorolac 1 drop into the left eye every 6 hours as needed for pain. Use warm compress for relief. Make sure to exercise proper hand hygiene.  Get help right away if: You have a fever and your symptoms suddenly get worse. You have severe pain when you move your eye. You have facial pain, redness, or swelling. You have sudden loss of vision.

## 2017-09-13 NOTE — Progress Notes (Signed)
Subjective:    Patient ID: Bailey [name], female    DOB: [birth year], 27 y.o.   MRN: [id number]  HPI:  [name] is here to establish as a new pt.  She is a pleasant 27 year old female. PMH: Anxiety and R sided TMJ.  She is under care of Ellieanna Funderburg, [facility]- monthly CBT and not currently on a rx. R sided jaw pain developed over christmas and is intermittent, throbbing/aching, rated 5/10, worsens with speaking and mastication.  She has appt with dentist tomorrow and has been treating with OTC NSAIDs with only minimal pain relief.  She estimates to drink 40 oz water/day, follows a heart healthy diet.   She is planning resuming yoga practice at least twice/week. She denies tobacco use and enjoys glass wine/night. She is married and has 2.28 yr old "[name]". She is an Public librarian at [employer]  Patient Care Team    Relationship Specialty Notifications Start End  Julaine Fusi, NP PCP - General Family Medicine  09/14/17     There are no active problems to display for this patient.    Past Medical History:  Diagnosis Date  . BV (bacterial vaginosis)   . IBS (irritable bowel syndrome)   . Scoliosis   . TB lung, latent   . Vaginal yeast infection      History reviewed. No pertinent surgical history.   Family History  Problem Relation Age of Onset  . Depression Mother   . Bipolar disorder Mother   . Heart attack Father        mid 62s  . Hypertension Father   . Suicidality Paternal Uncle   . Heart disease Paternal Uncle   . Cancer Maternal Grandmother 30       breast  . Cancer Paternal Grandmother        colon  . Hypertension Paternal Grandfather      Social History   Substance and Sexual Activity  Drug Use Yes  . Types: Marijuana     Social History   Substance and Sexual Activity  Alcohol Use Yes  . Alcohol/week: 3.6 oz  . Types: 6 Glasses of wine per week   Comment: rarely     Social History   Tobacco Use  Smoking Status Former Smoker   . Types: Cigarettes  Smokeless Tobacco Never Used     Outpatient Encounter Medications as of 09/14/2017  Medication Sig  . ibuprofen (ADVIL,MOTRIN) 200 MG tablet Take 200-400 mg by mouth every 6 (six) hours as needed. For pain.   Marland Kitchen levonorgestrel (MIRENA) 20 MCG/24HR IUD 1 each by Intrauterine route once.  . lansoprazole (PREVACID) 30 MG capsule Take 1 capsule (30 mg total) by mouth daily.  . [DISCONTINUED] albuterol (PROVENTIL HFA;VENTOLIN HFA) 108 (90 BASE) MCG/ACT inhaler Inhale 2 puffs into the lungs every 4 (four) hours as needed for wheezing.  . [DISCONTINUED] desogestrel-ethinyl estradiol (APRI,EMOQUETTE,SOLIA) 0.15-30 MG-MCG tablet Take 1 tablet by mouth daily.  . [DISCONTINUED] gatifloxacin (ZYMAXID) 0.5 % SOLN Place 1 drop into both eyes 4 (four) times daily. 1-2 drops every 2 hours while awake for 2 days, then every 4-8 hours for 5 days.  . [DISCONTINUED] guaiFENesin-codeine (ROBITUSSIN AC) 100-10 MG/5ML syrup Take 5 mLs by mouth 3 (three) times daily as needed for cough.  . [DISCONTINUED] HYDROcodone-acetaminophen (NORCO) 5-325 MG per tablet Take 2 tablets by mouth every 4 (four) hours as needed for pain.  . [DISCONTINUED] ketorolac (ACULAR) 0.5 % ophthalmic solution Place 1 drop into  the left eye every 6 (six) hours.  . [DISCONTINUED] methylPREDNISolone (MEDROL DOSEPAK) 4 MG tablet Take as directed   No facility-administered encounter medications on file as of 09/14/2017.     Allergies: Amoxicillin; Lactase; Penicillins; and Septra [bactrim]  Body mass index is 17.98 kg/m.  Blood pressure 113/74, pulse 77, height 5' 9.75" (1.772 m), weight 124 lb 6.4 oz (56.4 kg), last menstrual period 09/07/2017, SpO2 100 %.     Review of Systems  Constitutional: Positive for appetite change and fatigue. Negative for activity change, chills, diaphoresis, fever and unexpected weight change.  HENT: Negative for trouble swallowing and voice change.   Eyes: Negative for visual disturbance.   Respiratory: Negative for cough, chest tightness, shortness of breath, wheezing and stridor.   Cardiovascular: Negative for chest pain, palpitations and leg swelling.  Gastrointestinal: Negative for abdominal distention, abdominal pain, blood in stool, constipation, diarrhea, nausea and vomiting.  Endocrine: Negative for cold intolerance, heat intolerance, polydipsia, polyphagia and polyuria.  Genitourinary: Negative for difficulty urinating and flank pain.  Musculoskeletal: Positive for arthralgias and myalgias.  Hematological: Does not bruise/bleed easily.  Psychiatric/Behavioral: Negative for sleep disturbance. The patient is nervous/anxious.        Objective:   Physical Exam  Constitutional: She is oriented to person, place, and time. She appears well-developed and well-nourished. No distress.  HENT:  Mouth/Throat: There is trismus in the jaw. Normal dentition. No dental caries. No oropharyngeal exudate, posterior oropharyngeal edema, posterior oropharyngeal erythema or tonsillar abscesses.  R jaw TTP  Cardiovascular: Normal rate, regular rhythm, normal heart sounds and intact distal pulses.  No murmur heard. Pulmonary/Chest: Effort normal and breath sounds normal. No respiratory distress. She has no wheezes. She has no rales.  Neurological: She is oriented to person, place, and time.  Skin: Skin is warm and dry. No rash noted. She is not diaphoretic. No erythema. No pallor.  Psychiatric: She has a normal mood and affect. Her behavior is normal. Judgment and thought content normal.  Nursing note and vitals reviewed.         Assessment & Plan:   1. Healthcare maintenance   2. Arthralgia of right temporomandibular joint     Healthcare maintenance Increase water intake, strive for at least 60 ounces/day.   Follow Heart Healthy diet Increase regular exercise.  Recommend at least 30 minutes daily, 5 days per week of walking, jogging, biking, swimming, YouTube/Pinterest workout  videos. Tramadol for pain and please keep your dental appt for tomorrow. Referral to OB/GYN. Follow-up in 3 months- fasting labs and complete physical.  Arthralgia of right temporomandibular joint Tramadol for pain and please keep your dental appt for tomorrow.     FOLLOW-UP:  Return in about 3 months (around 12/13/2017) for CPE, Fasting Labs.

## 2017-09-14 ENCOUNTER — Encounter: Payer: Self-pay | Admitting: Adult Health

## 2017-09-14 ENCOUNTER — Other Ambulatory Visit: Payer: Self-pay | Admitting: Adult Health

## 2017-09-14 ENCOUNTER — Ambulatory Visit (INDEPENDENT_AMBULATORY_CARE_PROVIDER_SITE_OTHER): Payer: 59 | Admitting: Adult Health

## 2017-09-14 VITALS — BP 113/74 | HR 77 | Ht 69.75 in | Wt 124.4 lb

## 2017-09-14 DIAGNOSIS — Z0000 Encounter for general adult medical examination without abnormal findings: Secondary | ICD-10-CM

## 2017-09-14 DIAGNOSIS — M26621 Arthralgia of right temporomandibular joint: Secondary | ICD-10-CM

## 2017-09-14 MED ORDER — TRAMADOL HCL 50 MG PO TABS: 50.0000 mg | ORAL_TABLET | Freq: Three times a day (TID) | ORAL | 0 refills | Status: DC | PRN

## 2017-09-14 NOTE — Assessment & Plan Note (Signed)
Increase water intake, strive for at least 60 ounces/day.   Follow Heart Healthy diet Increase regular exercise.  Recommend at least 30 minutes daily, 5 days per week of walking, jogging, biking, swimming, YouTube/Pinterest workout videos. Tramadol for pain and please keep your dental appt for tomorrow. Referral to OB/GYN. Follow-up in 3 months- fasting labs and complete physical.

## 2017-09-14 NOTE — Assessment & Plan Note (Signed)
Tramadol for pain and please keep your dental appt for tomorrow.

## 2017-09-14 NOTE — Patient Instructions (Addendum)
Heart-Healthy Eating Plan Many factors influence your heart health, including eating and exercise habits. Heart (coronary) risk increases with abnormal blood fat (lipid) levels. Heart-healthy meal planning includes limiting unhealthy fats, increasing healthy fats, and making other small dietary changes. This includes maintaining a healthy body weight to help keep lipid levels within a normal range. What is my plan? Your health care provider recommends that you:  Get no more than __25__% of the total calories in your daily diet from fat.  Limit your intake of saturated fat to less than ___5___% of your total calories each day.  Limit the amount of cholesterol in your diet to less than __300__ mg per day.  What types of fat should I choose?  Choose healthy fats more often. Choose monounsaturated and polyunsaturated fats, such as olive oil and canola oil, flaxseeds, walnuts, almonds, and seeds.  Eat more omega-3 fats. Good choices include salmon, mackerel, sardines, tuna, flaxseed oil, and ground flaxseeds. Aim to eat fish at least two times each week.  Limit saturated fats. Saturated fats are primarily found in animal products, such as meats, butter, and cream. Plant sources of saturated fats include palm oil, palm kernel oil, and coconut oil.  Avoid foods with partially hydrogenated oils in them. These contain trans fats. Examples of foods that contain trans fats are stick margarine, some tub margarines, cookies, crackers, and other baked goods. What general guidelines do I need to follow?  Check food labels carefully to identify foods with trans fats or high amounts of saturated fat.  Fill one half of your plate with vegetables and green salads. Eat 4-5 servings of vegetables per day. A serving of vegetables equals 1 cup of raw leafy vegetables,  cup of raw or cooked cut-up vegetables, or  cup of vegetable juice.  Fill one fourth of your plate with whole grains. Look for the word "whole"  as the first word in the ingredient list.  Fill one fourth of your plate with lean protein foods.  Eat 4-5 servings of fruit per day. A serving of fruit equals one medium whole fruit,  cup of dried fruit,  cup of fresh, frozen, or canned fruit, or  cup of 100% fruit juice.  Eat more foods that contain soluble fiber. Examples of foods that contain this type of fiber are apples, broccoli, carrots, beans, peas, and barley. Aim to get 20-30 g of fiber per day.  Eat more home-cooked food and less restaurant, buffet, and fast food.  Limit or avoid alcohol.  Limit foods that are high in starch and sugar.  Avoid fried foods.  Cook foods by using methods other than frying. Baking, boiling, grilling, and broiling are all great options. Other fat-reducing suggestions include: ? Removing the skin from poultry. ? Removing all visible fats from meats. ? Skimming the fat off of stews, soups, and gravies before serving them. ? Steaming vegetables in water or broth.  Lose weight if you are overweight. Losing just 5-10% of your initial body weight can help your overall health and prevent diseases such as diabetes and heart disease.  Increase your consumption of nuts, legumes, and seeds to 4-5 servings per week. One serving of dried beans or legumes equals  cup after being cooked, one serving of nuts equals 1 ounces, and one serving of seeds equals  ounce or 1 tablespoon.  You may need to monitor your salt (sodium) intake, especially if you have high blood pressure. Talk with your health care provider or dietitian to get  more information about reducing sodium. What foods can I eat? Grains  Breads, including [place], white, pita, wheat, raisin, rye, oatmeal, and [place]. Tortillas that are neither fried nor made with lard or trans fat. Low-fat rolls, including hotdog and hamburger buns and English muffins. Biscuits. Muffins. Waffles. Pancakes. Light popcorn. Whole-grain cereals. Flatbread. Melba  toast. Pretzels. Breadsticks. Rusks. Low-fat snacks and crackers, including oyster, saltine, matzo, graham, animal, and rye. Rice and pasta, including brown rice and those that are made with whole wheat. Vegetables All vegetables. Fruits All fruits, but limit coconut. Meats and Other Protein Sources Lean, well-trimmed beef, veal, pork, and lamb. Chicken and [place] without skin. All fish and shellfish. Wild duck, rabbit, pheasant, and venison. Egg whites or low-cholesterol egg substitutes. Dried beans, peas, lentils, and tofu.Seeds and most nuts. Dairy Low-fat or nonfat cheeses, including ricotta, string, and mozzarella. Skim or 1% milk that is liquid, powdered, or evaporated. Buttermilk that is made with low-fat milk. Nonfat or low-fat yogurt. Beverages Mineral water. Diet carbonated beverages. Sweets and Desserts Sherbets and fruit ices. Honey, jam, marmalade, jelly, and syrups. Meringues and gelatins. Pure sugar candy, such as hard candy, jelly beans, gumdrops, mints, marshmallows, and small amounts of dark chocolate. [employer] Eat all sweets and desserts in moderation. Fats and Oils Nonhydrogenated (trans-free) margarines. Vegetable oils, including soybean, sesame, sunflower, olive, peanut, safflower, corn, canola, and cottonseed. Salad dressings or mayonnaise that are made with a vegetable oil. Limit added fats and oils that you use for cooking, baking, salads, and as spreads. Other Cocoa powder. Coffee and tea. All seasonings and condiments. The items listed above may not be a complete list of recommended foods or beverages. Contact your dietitian for more options. What foods are not recommended? Grains Breads that are made with saturated or trans fats, oils, or whole milk. Croissants. Butter rolls. Cheese breads. Sweet rolls. Donuts. Buttered popcorn. Chow mein noodles. High-fat crackers, such as cheese or butter crackers. Meats and Other Protein Sources Fatty meats, such as  hotdogs, short ribs, sausage, spareribs, bacon, ribeye roast or steak, and mutton. High-fat deli meats, such as salami and bologna. Caviar. Domestic duck and goose. Organ meats, such as kidney, liver, sweetbreads, brains, gizzard, chitterlings, and heart. Dairy Cream, sour cream, cream cheese, and creamed cottage cheese. Whole milk cheeses, including blue (bleu), Monterey Jack, [place], [place], American, [place], Swiss, [place], [place], and [place]. Whole or 2% milk that is liquid, evaporated, or condensed. Whole buttermilk. Cream sauce or high-fat cheese sauce. Yogurt that is made from whole milk. Beverages Regular sodas and drinks with added sugar. Sweets and Desserts Frosting. Pudding. Cookies. Cakes other than angel food cake. Candy that has milk chocolate or white chocolate, hydrogenated fat, butter, coconut, or unknown ingredients. Buttered syrups. Full-fat ice cream or ice cream drinks. Fats and Oils Gravy that has suet, meat fat, or shortening. Cocoa butter, hydrogenated oils, palm oil, coconut oil, palm kernel oil. These can often be found in baked products, candy, fried foods, nondairy creamers, and whipped toppings. Solid fats and shortenings, including bacon fat, salt pork, lard, and butter. Nondairy cream substitutes, such as coffee creamers and sour cream substitutes. Salad dressings that are made of unknown oils, cheese, or sour cream. The items listed above may not be a complete list of foods and beverages to avoid. Contact your dietitian for more information. This information is not intended to replace advice given to you by your health care provider. Make sure you discuss any questions you have with your health care  provider. Document Released: 06/03/2008 Document Revised: 03/14/2016 Document Reviewed: 02/16/2014 Elsevier Interactive Patient Education  2018 [employer] Inc.  Temporomandibular Joint Syndrome Temporomandibular joint (TMJ) syndrome is a condition that affects the joints  between your jaw and your skull. The TMJs are located near your ears and allow your jaw to open and close. These joints and the nearby muscles are involved in all movements of the jaw. People with TMJ syndrome have pain in the area of these joints and muscles. Chewing, biting, or other movements of the jaw can be difficult or painful. TMJ syndrome can be caused by various things. In many cases, the condition is mild and goes away within a few weeks. For some people, the condition can become a long-term problem. What are the causes? Possible causes of TMJ syndrome include:  Grinding your teeth or clenching your jaw. Some people do this when they are under stress.  Arthritis.  Injury to the jaw.  Head or neck injury.  Teeth or dentures that are not aligned well.  In some cases, the cause of TMJ syndrome may not be known. What are the signs or symptoms? The most common symptom is an aching pain on the side of the head in the area of the TMJ. Other symptoms may include:  Pain when moving your jaw, such as when chewing or biting.  Being unable to open your jaw all the way.  Making a clicking sound when you open your mouth.  Headache.  Earache.  Neck or shoulder pain.  How is this diagnosed? Diagnosis can usually be made based on your symptoms, your medical history, and a physical exam. Your health care provider may check the range of motion of your jaw. Imaging tests, such as X-rays or an MRI, are sometimes done. You may need to see your dentist to determine if your teeth and jaw are lined up correctly. How is this treated? TMJ syndrome often goes away on its own. If treatment is needed, the options may include:  Eating soft foods and applying ice or heat.  Medicines to relieve pain or inflammation.  Medicines to relax the muscles.  A splint, bite plate, or mouthpiece to prevent teeth grinding or jaw clenching.  Relaxation techniques or counseling to help reduce  stress.  Transcutaneous electrical nerve stimulation (TENS). This helps to relieve pain by applying an electrical current through the skin.  Acupuncture. This is sometimes helpful to relieve pain.      Jaw surgery. This is rarely needed.  Follow these instructions at home:  Take medicines only as directed by your health care provider.  Eat a soft diet if you are having trouble chewing.  Apply ice to the painful area. ? Put ice in a plastic bag. ? Place a towel between your skin and the bag. ? Leave the ice on for 20 minutes, 2-3 times a day.  Apply a warm compress to the painful area as directed.  Massage your jaw area and perform any jaw stretching exercises as recommended by your health care provider.  If you were given a mouthpiece or bite plate, wear it as directed.  Avoid foods that require a lot of chewing. Do not chew gum.  Keep all follow-up visits as directed by your health care provider. This is important. Contact a health care provider if:  You are having trouble eating.  You have new or worsening symptoms. Get help right away if:  Your jaw locks open or closed. This information is not intended to  replace advice given to you by your health care provider. Make sure you discuss any questions you have with your health care provider. Document Released: 05/20/2001 Document Revised: 04/24/2016 Document Reviewed: 03/30/2014 Elsevier Interactive Patient Education  2018 [employer].  Increase water intake, strive for at least 60 ounces/day.   Follow Heart Healthy diet Increase regular exercise.  Recommend at least 30 minutes daily, 5 days per week of walking, jogging, biking, swimming, YouTube/Pinterest workout videos. Tramadol for pain and please keep your dental appt for tomorrow. Referral to OB/GYN. Follow-up in 3 months- fasting labs and complete physical. WELCOME TO THE PRACTICE!

## 2017-12-07 ENCOUNTER — Other Ambulatory Visit: Payer: 59

## 2017-12-16 NOTE — Progress Notes (Deleted)
Subjective:    Patient ID: Bailey [name], female    DOB: 1991-04-09, 27 y.o.   MRN: [id number]  HPI:  09/14/17 OV: Ms. Hunnicutt is here to establish as a new pt.  She is a pleasant 27 year old female. PMH: Anxiety and R sided TMJ.  She is under care of Wade Sigala, [facility]- monthly CBT and not currently on a rx. R sided jaw pain developed over christmas and is intermittent, throbbing/aching, rated 5/10, worsens with speaking and mastication.  She has appt with dentist tomorrow and has been treating with OTC NSAIDs with only minimal pain relief.  She estimates to drink 40 oz water/day, follows a heart healthy diet.   She is planning resuming yoga practice at least twice/week. She denies tobacco use and enjoys glass wine/night. She is married and has 2.28 yr old "Bailey [name]". 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 Shelden is here for CPE  Healthcare Maintenance: PAP- Mammogram-not indicated Immunizations-  Patient Care Team    Relationship Specialty Notifications Start End  Julaine Fusi, NP PCP - General Family Medicine  09/14/17     Patient Active Problem List   Diagnosis Date Noted  . Arthralgia of right temporomandibular joint 09/14/2017  . Healthcare maintenance 09/14/2017     Past Medical History:  Diagnosis Date  . BV (bacterial vaginosis)   . IBS (irritable bowel syndrome)   . Scoliosis   . TB lung, latent   . Vaginal yeast infection      No past surgical history on file.   Family History  Problem Relation Age of Onset  . Depression Mother   . Bipolar disorder Mother   . Heart attack Father        mid 65s  . Hypertension Father   . Suicidality Paternal Uncle   . Heart disease Paternal Uncle   . Cancer Maternal Grandmother 30       breast  . Cancer Paternal Grandmother        colon  . Hypertension Paternal Grandfather      Social History   Substance and Sexual Activity  Drug Use Yes  . Types: Marijuana     Social History    Substance and Sexual Activity  Alcohol Use Yes  . Alcohol/week: 3.6 oz  . Types: 6 Glasses of wine per week   Comment: rarely     Social History   Tobacco Use  Smoking Status Former Smoker  . Types: Cigarettes  Smokeless Tobacco Never Used     Outpatient Encounter Medications as of 12/21/2017  Medication Sig  . ibuprofen (ADVIL,MOTRIN) 200 MG tablet Take 200-400 mg by mouth every 6 (six) hours as needed. For pain.   Marland Kitchen lansoprazole (PREVACID) 30 MG capsule Take 1 capsule (30 mg total) by mouth daily.  Marland Kitchen levonorgestrel (MIRENA) 20 MCG/24HR IUD 1 each by Intrauterine route once.  . traMADol (ULTRAM) 50 MG tablet Take 1 tablet (50 mg total) by mouth every 8 (eight) hours as needed.   No facility-administered encounter medications on file as of 12/21/2017.     Allergies: Amoxicillin; Lactase; Penicillins; and Septra [bactrim]  There is no height or weight on file to calculate BMI.  There were no vitals taken for this visit.     Review of Systems  Constitutional: Positive for appetite change and fatigue. Negative for activity change, chills, diaphoresis, fever and unexpected weight change.  HENT: Negative for trouble swallowing and voice change.   Eyes:  Negative for visual disturbance.  Respiratory: Negative for cough, chest tightness, shortness of breath, wheezing and stridor.   Cardiovascular: Negative for chest pain, palpitations and leg swelling.  Gastrointestinal: Negative for abdominal distention, abdominal pain, blood in stool, constipation, diarrhea, nausea and vomiting.  Endocrine: Negative for cold intolerance, heat intolerance, polydipsia, polyphagia and polyuria.  Genitourinary: Negative for difficulty urinating and flank pain.  Musculoskeletal: Positive for arthralgias and myalgias.  Hematological: Does not bruise/bleed easily.  Psychiatric/Behavioral: Negative for sleep disturbance. The patient is nervous/anxious.        Objective:   Physical Exam   Constitutional: She is oriented to person, place, and time. She appears well-developed and well-nourished. No distress.  HENT:  Mouth/Throat: There is trismus in the jaw. Normal dentition. No dental caries. No oropharyngeal exudate, posterior oropharyngeal edema, posterior oropharyngeal erythema or tonsillar abscesses.  R jaw TTP  Cardiovascular: Normal rate, regular rhythm, normal heart sounds and intact distal pulses.  No murmur heard. Pulmonary/Chest: Effort normal and breath sounds normal. No respiratory distress. She has no wheezes. She has no rales.  Neurological: She is oriented to person, place, and time.  Skin: Skin is warm and dry. No rash noted. She is not diaphoretic. No erythema. No pallor.  Psychiatric: She has a normal mood and affect. Her behavior is normal. Judgment and thought content normal.  Nursing note and vitals reviewed.         Assessment & Plan:   No diagnosis found.  No problem-specific Assessment & Plan notes found for this encounter.    FOLLOW-UP:  No follow-ups on file.

## 2017-12-21 ENCOUNTER — Encounter: Payer: 59 | Admitting: Adult Health

## 2018-03-09 ENCOUNTER — Emergency Department (HOSPITAL_COMMUNITY): Payer: 59

## 2018-03-09 ENCOUNTER — Emergency Department (HOSPITAL_COMMUNITY)
Admission: EM | Admit: 2018-03-09 | Discharge: 2018-03-09 | Disposition: A | Discharge status: Home / Self Care | Payer: 59 | Attending: Emergency Medicine | Admitting: Emergency Medicine

## 2018-03-09 ENCOUNTER — Encounter (HOSPITAL_COMMUNITY): Payer: Self-pay | Admitting: Emergency Medicine

## 2018-03-09 DIAGNOSIS — Z87891 Personal history of nicotine dependence: Secondary | ICD-10-CM | POA: Insufficient documentation

## 2018-03-09 DIAGNOSIS — Y929 Unspecified place or not applicable: Secondary | ICD-10-CM | POA: Diagnosis not present

## 2018-03-09 DIAGNOSIS — S91342A Puncture wound with foreign body, left foot, initial encounter: Secondary | ICD-10-CM | POA: Diagnosis not present

## 2018-03-09 DIAGNOSIS — Y998 Other external cause status: Secondary | ICD-10-CM | POA: Insufficient documentation

## 2018-03-09 DIAGNOSIS — S91135A Puncture wound without foreign body of left lesser toe(s) without damage to nail, initial encounter: Secondary | ICD-10-CM | POA: Diagnosis not present

## 2018-03-09 DIAGNOSIS — X58XXXA Exposure to other specified factors, initial encounter: Secondary | ICD-10-CM | POA: Diagnosis not present

## 2018-03-09 DIAGNOSIS — S90852A Superficial foreign body, left foot, initial encounter: Secondary | ICD-10-CM | POA: Diagnosis not present

## 2018-03-09 DIAGNOSIS — Z79899 Other long term (current) drug therapy: Secondary | ICD-10-CM | POA: Insufficient documentation

## 2018-03-09 DIAGNOSIS — Y9389 Activity, other specified: Secondary | ICD-10-CM | POA: Insufficient documentation

## 2018-03-09 MED ORDER — CEPHALEXIN 250 MG PO CAPS: 250.0000 mg | ORAL_CAPSULE | Freq: Three times a day (TID) | ORAL | 0 refills | Status: AC

## 2018-03-09 MED ORDER — LIDOCAINE-EPINEPHRINE (PF) 2 %-1:200000 IJ SOLN
10.0000 mL | Freq: Once | INTRAMUSCULAR | Status: AC
  Administered 2018-03-09: 10 mL

## 2018-03-09 NOTE — ED Triage Notes (Signed)
Patient here from home with complaints of left foot pain. States that she got a skewer stuck in her foot that was caught in the rug. Unable to get out.

## 2018-03-09 NOTE — Discharge Instructions (Signed)
Take antibiotics as prescribed.  Take the entire course, even if your symptoms improve. Wear the postop shoe until foreign bodies removed. Use Tylenol or ibuprofen as needed for pain. Keep the area covered and clean until evaluated by orthopedics. Follow-up with Dr. Victorino DikeHewitt from orthopedics.  Call his office tomorrow to set up an appointment. Return to the emergency room if you develop fevers, numbness, or any new or concerning symptoms.

## 2018-03-09 NOTE — ED Provider Notes (Addendum)
[facility]-EMERGENCY DEPT Provider Note   CSN: [id number] Arrival date & time: 03/09/18  1653     History   Chief Complaint Chief Complaint  Patient presents with  . Foot Pain    HPI Bailey [name] is a 27 y.o. female presented for evaluation of left foot pain.  Patient states that just prior to arrival, she stepped on a wooden skewer.  When she tried to pull it out, the skewer broke, and the tip was stuck in her foot.  She reports acute onset of pain.  Worse when walking.  She has not taken anything for pain including Tylenol or ibuprofen.  Nothing makes it better.  She has been icing it to try and help with swelling.  She has no medical problems, does not take medications daily.  Tetanus is up-to-date.  She is not on blood thinners.  HPI  Past Medical History:  Diagnosis Date  . BV (bacterial vaginosis)   . IBS (irritable bowel syndrome)   . Scoliosis   . TB lung, latent   . Vaginal yeast infection     Patient Active Problem List   Diagnosis Date Noted  . Arthralgia of right temporomandibular joint 09/14/2017  . Healthcare maintenance 09/14/2017    History reviewed. No pertinent surgical history.   OB History   None      Home Medications    Prior to Admission medications   Medication Sig Start Date End Date Taking? Authorizing Provider  cephALEXin (KEFLEX) 250 MG capsule Take 1 capsule (250 mg total) by mouth 3 (three) times daily for 7 days. 03/09/18 03/16/18  Almando Brawley, PA-C  ibuprofen (ADVIL,MOTRIN) 200 MG tablet Take 200-400 mg by mouth every 6 (six) hours as needed. For pain.     [provider]  lansoprazole (PREVACID) 30 MG capsule Take 1 capsule (30 mg total) by mouth daily. 03/22/12 03/22/13  Loren RacerYelverton, David, MD  levonorgestrel (MIRENA) 20 MCG/24HR IUD 1 each by Intrauterine route once.    [provider]  traMADol (ULTRAM) 50 MG tablet Take 1 tablet (50 mg total) by mouth every 8 (eight) hours as needed.  09/14/17   Julaine Fusianford, Katy D, NP    Family History Family History  Problem Relation Age of Onset  . Depression Mother   . Bipolar disorder Mother   . Heart attack Father        mid 6940s  . Hypertension Father   . Suicidality Paternal Uncle   . Heart disease Paternal Uncle   . Cancer Maternal Grandmother 30       breast  . Cancer Paternal Grandmother        colon  . Hypertension Paternal Grandfather     Social History Social History   Tobacco Use  . Smoking status: Former Smoker    Types: Cigarettes  . Smokeless tobacco: Never Used  Substance Use Topics  . Alcohol use: Yes    Alcohol/week: 3.6 oz    Types: 6 Glasses of wine per week    Comment: rarely  . Drug use: Yes    Types: Marijuana     Allergies   Amoxicillin; Lactase; Penicillins; and Septra [bactrim]   Review of Systems Review of Systems  Musculoskeletal: Positive for myalgias.  Neurological: Negative for numbness.  Hematological: Does not bruise/bleed easily.     Physical Exam Updated Vital Signs BP (!) 143/75 (BP Location: Left Arm)   Pulse (!) 105   Temp 98 F (36.7 C) (Oral)   Resp  19   SpO2 100%   Physical Exam  Constitutional: She is oriented to person, place, and time. She appears well-developed and well-nourished. No distress.  HENT:  Head: Normocephalic and atraumatic.  Eyes: EOM are normal.  Neck: Normal range of motion.  Pulmonary/Chest: Effort normal.  Abdominal: She exhibits no distension.  Musculoskeletal: She exhibits tenderness.  Puncture wound of the dorsal left foot without active bleeding.  Tenderness palpation of surrounding area.  Difficulty moving toes due to pain.  No numbness.  Neurological: She is alert and oriented to person, place, and time. No sensory deficit.  Skin: Skin is warm. Capillary refill takes less than 2 seconds. No rash noted.  Psychiatric: She has a normal mood and affect.  Nursing note and vitals reviewed.    ED Treatments / Results  Labs (all  labs ordered are listed, but only abnormal results are displayed) Labs Reviewed - No data to display  EKG None  Radiology Dg Foot Complete Left  Result Date: 03/09/2018 CLINICAL DATA:  Puncture wound by a wooden skewer at plantar surface of foot proximal to second toe EXAM: LEFT FOOT - COMPLETE 3+ VIEW COMPARISON:  12/11/2010 FINDINGS: Osseous mineralization normal. Joint spaces preserved. No acute fracture, dislocation, or bone destruction. Tiny plantar calcaneal spur. No radiopaque foreign body or soft tissue gas. IMPRESSION: No acute abnormalities. Electronically Signed   By: Ulyses Southward M.D.   On: 03/09/2018 18:02    Procedures .Foreign Body Removal Date/Time: 03/09/2018 10:01 PM Performed by: Alveria Apley, PA-C Authorized by: Alveria Apley, PA-C  Consent: Verbal consent obtained. Risks and benefits: risks, benefits and alternatives were discussed Consent given by: patient Body area: skin General location: lower extremity Location details: left foot Anesthesia: local infiltration  Anesthesia: Local Anesthetic: lidocaine 2% with epinephrine Anesthetic total: 8 mL  Sedation: Patient sedated: no  Patient restrained: no Patient cooperative: yes Localization method: ultrasound Removal mechanism: scalpel, hemostat and forceps Dressing: dressing applied Tendon involvement: none Depth: deep Complexity: complex 0 objects recovered. Post-procedure assessment: foreign body not removed Patient tolerance: Patient tolerated the procedure well with no immediate complications   (including critical care time)  Medications Ordered in ED Medications  lidocaine-EPINEPHrine (XYLOCAINE W/EPI) 2 %-1:200000 (PF) injection 10 mL (has no administration in time range)     Initial Impression / Assessment and Plan / ED Course  I have reviewed the triage vital signs and the nursing notes.  Pertinent labs & imaging results that were available during my care of the patient were  reviewed by me and considered in my medical decision making (see chart for details).     Patient presenting for foreign body stuck in her left foot.  Patient has difficulty moving her toes due to pain, but appears neurovascularly intact.  X-ray reviewed and interpreted by me, no fractures or dislocation.  No foreign body seen on film, however it was a wooden object.  On review with ultrasound, patient has foreign body noted.  Appears approximately 1 cm long, angled towards the heel.  Foreign body is at the metatarsal head.  No active drainage.  Area was numbed, incision made, and attempted foreign body removal.  This was unsuccessful.  Discussed with attending, Dr. Rubin Payor evaluated the patient, and under ultrasound attempted foreign body removal without success.  Will consult with orthopedics.  Discussed with Dr. Victorino Dike, who recommends postop shoe, antibiotics, and follow-up in office.  Recommends calling the office tomorrow to set up an appointment.  Recommending Keflex, discussed patient's hive reaction to penicillins  and amoxicillin, states Keflex should be okay.  Discussed wound care with patient.  Discussed importance of follow-up.  At this time, patient appears safe for discharge.  Return precautions given.  Patient states she understands and agrees to plan.  Final Clinical Impressions(s) / ED Diagnoses   Final diagnoses:  Foreign body in left foot, initial encounter    ED Discharge Orders        Ordered    cephALEXin (KEFLEX) 250 MG capsule  3 times daily     03/09/18 2033       Alveria Apley, PA-C 03/09/18 2107    Alveria Apley, PA-C 03/09/18 2203    Benjiman Core, MD 03/10/18 0021

## 2018-03-09 NOTE — ED Notes (Signed)
Patient transported to X-ray 

## 2018-03-10 ENCOUNTER — Other Ambulatory Visit: Payer: Self-pay | Admitting: Orthopedic Surgery

## 2018-03-10 DIAGNOSIS — M79672 Pain in left foot: Secondary | ICD-10-CM | POA: Diagnosis not present

## 2018-03-15 DIAGNOSIS — M795 Residual foreign body in soft tissue: Secondary | ICD-10-CM | POA: Diagnosis not present

## 2018-03-15 DIAGNOSIS — M79672 Pain in left foot: Secondary | ICD-10-CM | POA: Diagnosis not present

## 2018-03-17 DIAGNOSIS — Z79899 Other long term (current) drug therapy: Secondary | ICD-10-CM | POA: Diagnosis not present

## 2018-03-17 DIAGNOSIS — Z01812 Encounter for preprocedural laboratory examination: Secondary | ICD-10-CM | POA: Diagnosis not present

## 2018-03-17 DIAGNOSIS — M795 Residual foreign body in soft tissue: Secondary | ICD-10-CM | POA: Diagnosis not present

## 2018-03-18 ENCOUNTER — Ambulatory Visit (HOSPITAL_BASED_OUTPATIENT_CLINIC_OR_DEPARTMENT_OTHER): Admit: 2018-03-18 | Payer: 59 | Admitting: Orthopedic Surgery

## 2018-03-18 ENCOUNTER — Encounter (HOSPITAL_BASED_OUTPATIENT_CLINIC_OR_DEPARTMENT_OTHER): Payer: Self-pay

## 2018-03-18 SURGERY — REMOVAL FOREIGN BODY EXTREMITY
Anesthesia: General | Laterality: Left

## 2018-03-23 DIAGNOSIS — M795 Residual foreign body in soft tissue: Secondary | ICD-10-CM | POA: Diagnosis not present

## 2018-03-26 DIAGNOSIS — M12272 Villonodular synovitis (pigmented), left ankle and foot: Secondary | ICD-10-CM | POA: Diagnosis not present

## 2018-04-20 NOTE — Progress Notes (Signed)
This encounter was created in error - please disregard.

## 2018-05-05 DIAGNOSIS — M12272 Villonodular synovitis (pigmented), left ankle and foot: Secondary | ICD-10-CM | POA: Diagnosis not present

## 2018-11-22 DIAGNOSIS — Z124 Encounter for screening for malignant neoplasm of cervix: Secondary | ICD-10-CM | POA: Diagnosis not present

## 2018-11-22 DIAGNOSIS — Z01419 Encounter for gynecological examination (general) (routine) without abnormal findings: Secondary | ICD-10-CM | POA: Diagnosis not present

## 2019-02-17 ENCOUNTER — Encounter: Payer: Self-pay | Admitting: Adult Health

## 2019-02-17 ENCOUNTER — Other Ambulatory Visit: Payer: Self-pay

## 2019-02-17 ENCOUNTER — Ambulatory Visit (INDEPENDENT_AMBULATORY_CARE_PROVIDER_SITE_OTHER): Payer: 59 | Admitting: Adult Health

## 2019-02-17 DIAGNOSIS — F411 Generalized anxiety disorder: Secondary | ICD-10-CM | POA: Diagnosis not present

## 2019-02-17 MED ORDER — LORAZEPAM 0.5 MG PO TABS: 0.5000 mg | ORAL_TABLET | Freq: Two times a day (BID) | ORAL | 0 refills | Status: DC | PRN

## 2019-02-17 MED ORDER — PAROXETINE HCL 20 MG PO TABS: ORAL_TABLET | ORAL | 0 refills | Status: DC

## 2019-02-17 NOTE — Assessment & Plan Note (Addendum)
[place] Controlled Substance Database reviewed- no aberrancies noted Increase therapy sessions- recommend at a min bi-weekly Started on Paroxetine 66mf- 1/2 tab daily for one week, then increase to one tablet daily- hold at this dose Lorazepam 0.5mg  Q12H PRN acute anxiety or insomnia Advised not to drink ETOH or drive when using Increase regular exercise F/u 6 weeks TeleMedicine, sooner if needed. Continue to social distance and wear a mask when out in public.

## 2019-02-17 NOTE — Progress Notes (Signed)
Virtual Visit via Telephone Note  I connected with Bailey [name] on 02/17/19 at  3:15 PM EDT by telephone and verified that I am speaking with the correct person using two identifiers.  Location: Patient: Home Provider: In Clinic   I discussed the limitations, risks, security and privacy concerns of performing an evaluation and management service by telephone and the availability of in person appointments. I also discussed with the patient that there may be a patient responsible charge related to this service. The patient expressed understanding and agreed to proceed.   History of Present Illness: 09/14/2018 OV: Bailey [name] is here to establish as a new pt.  She is a pleasant 28 year old female. PMH: Anxiety and R sided TMJ.  She is under care of Quenisha Lovins, [facility]- monthly CBT and not currently on a rx. R sided jaw pain developed over christmas and is intermittent, throbbing/aching, rated 5/10, worsens with speaking and mastication.  She has appt with dentist tomorrow and has been treating with OTC NSAIDs with only minimal pain relief.  She estimates to drink 40 oz water/day, follows a heart healthy diet.   She is planning resuming yoga practice at least twice/week. She denies tobacco use and enjoys glass wine/night. She is married and has 2.29 yr old "Bailey [name]". She is an Engineering geologist at [employer]  02/17/2019 OV: Ms. [name] calls in today with increase with anxiety and OCD behavior, ie constantly checking Bailey and Bailey son's temp- up to 4 times/day. She reports constant worry about contracting and subsequently dying from COVID-19. Bailey father passed away from a MI when she was 31. She recently returned to work- Engineering geologist at [employer]- and she is worried about Bailey close contact with the public. She wears a mask when performing procedures and she is diligent about hand hygiene. She has been consistent about social distancing and wearing a mask when out in public. She, Bailey  [name] (55 year old son), and Bailey [name] are all in excellent health. She denies depression or SI/HI She has reduced ETOH She has reduced exercise due to lack of interest and low energy. She has never been on SSRI or SNRI She reports occasionally using Bailey [name]'s Alprazolam for acute anxiety. She denies sx's of COVID-19 When she checks Bailey temp, all readings <45f oral  Patient Care Team    Relationship Specialty Notifications Start End  Esaw Grandchild, NP PCP - General Family Medicine  09/14/17     Patient Active Problem List   Diagnosis Date Noted  . Arthralgia of right temporomandibular joint 09/14/2017  . Healthcare maintenance 09/14/2017     Past Medical History:  Diagnosis Date  . BV (bacterial vaginosis)   . IBS (irritable bowel syndrome)   . Scoliosis   . TB lung, latent   . Vaginal yeast infection      No past surgical history on file.   Family History  Problem Relation Age of Onset  . Depression Mother   . Bipolar disorder Mother   . Heart attack Father        mid 9s  . Hypertension Father   . Suicidality Paternal Uncle   . Heart disease Paternal Uncle   . Cancer Maternal Grandmother 69       breast  . Cancer Paternal Grandmother        colon  . Hypertension Paternal Grandfather      Social History   Substance and Sexual Activity  Drug Use Yes  .  Types: Marijuana     Social History   Substance and Sexual Activity  Alcohol Use Yes  . Alcohol/week: 6.0 standard drinks  . Types: 6 Glasses of wine per week   Comment: rarely     Social History   Tobacco Use  Smoking Status Former Smoker  . Types: Cigarettes  Smokeless Tobacco Never Used     Outpatient Encounter Medications as of 02/17/2019  Medication Sig  . ibuprofen (ADVIL,MOTRIN) 200 MG tablet Take 200-400 mg by mouth every 6 (six) hours as needed. For pain.   Marland Kitchen. lansoprazole (PREVACID) 30 MG capsule Take 1 capsule (30 mg total) by mouth daily.  Marland Kitchen. levonorgestrel (MIRENA) 20  MCG/24HR IUD 1 each by Intrauterine route once.  . traMADol (ULTRAM) 50 MG tablet Take 1 tablet (50 mg total) by mouth every 8 (eight) hours as needed.   No facility-administered encounter medications on file as of 02/17/2019.     Allergies: Amoxicillin, Lactase, Penicillins, and Septra [bactrim]  There is no height or weight on file to calculate BMI.  There were no vitals taken for this visit. Review of Systems: General:   Denies fever, chills, unexplained weight loss.  Optho/Auditory:   Denies visual changes, blurred vision/LOV Respiratory:   Denies SOB, DOE more than baseline levels.  Cardiovascular:   Denies chest pain, palpitations, new onset peripheral edema  Gastrointestinal:   Denies nausea, vomiting, diarrhea.  Genitourinary: Denies dysuria, freq/ urgency, flank pain or discharge from genitals.  Endocrine:     Denies hot or cold intolerance, polyuria, polydipsia. Musculoskeletal:   Denies unexplained myalgias, joint swelling, unexplained arthralgias, gait problems.  Skin:  Denies rash, suspicious lesions Neurological:     Denies dizziness, unexplained weakness, numbness  Psychiatric/Behavioral:   Increase in anxiety +, suicidal or homicidal ideations -, hallucinations- COVID-19 Education: Signs and symptoms of COVID-19 infection were discussed with pt and how to seek care for testing.  The importance of following the Stay at Home order, and when out- Social Distancing and wearing a facial mask were discussed today. Observations/Objective: No acute distress noted during the telephone conversation.   Assessment and Plan: [facility] [place] [facility] [place]- no aberrancies noted Increase therapy sessions- recommend at a min bi-weekly Started on Paroxetine 20mf- 1/2 tab daily for one week, then increase to one tablet daily- hold at this dose Lorazepam 0.5mg  Q12H PRN acute anxiety or insomnia Advised not to drink ETOH or drive when using Increase regular  exercise Continue to social distance and wear a mask when out in public.  Follow Up Instructions: F/u 6 weeks TeleMedicine, sooner if needed.   I discussed the assessment and treatment plan with the patient. The patient was provided an opportunity to ask questions and all were answered. The patient agreed with the plan and demonstrated an understanding of the instructions.   The patient was advised to call back or seek an in-person evaluation if the symptoms worsen or if the condition fails to improve as anticipated.  I provided 32 minutes of non-face-to-face time during this encounter.   Julaine FusiKaty D Hanin Decook, NP

## 2019-03-27 NOTE — Progress Notes (Signed)
Virtual Visit via Telephone Note  I connected with Bailey [name] on 03/28/2019 at  1:45 PM EDT by telephone and verified that I am speaking with the correct person using two identifiers.  Location: Patient: Home Provider: In Clinic   I discussed the limitations, risks, security and privacy concerns of performing an evaluation and management service by telephone and the availability of in person appointments. I also discussed with the patient that there may be a patient responsible charge related to this service. The patient expressed understanding and agreed to proceed.   History of Present Illness: 09/14/2018 OV: Bailey [name] is here to establish as a new pt. She is a pleasant 28 year old female. PMH: Anxiety and R sided TMJ. She is under care of Ryleeann Urquiza, [facility]- monthly CBT and not currently on a rx. R sided jaw pain developed over christmas and is intermittent, throbbing/aching, rated 5/10, worsens with speaking and mastication. She has appt with dentist tomorrow and has been treating with OTC NSAIDs with only minimal pain relief.  She estimates to drink 40 oz water/day, follows a heart healthy diet. She is planning resuming yoga practice at least twice/week. She denies tobacco use and enjoys glass wine/night. She is married and has 2.29 yr old "[name]". 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 She reports constant worry about contracting and subsequently dying from COVID-19. Her father passed away from a MI when she was 39. 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 She wears a mask when performing procedures and she is diligent about hand hygiene. She has been consistent about social distancing and wearing a mask when out in public. She, her  child (11 year old son), and her husband are all in excellent health. She denies depression or SI/HI She has reduced ETOH She has reduced exercise due to lack of interest and low energy. She has never been on SSRI or SNRI She reports occasionally using her husband's Alprazolam for acute anxiety. She denies sx's of COVID-19 When she checks her temp, all readings <31f oral 03/28/2019 OV: Bailey [name] calls in today with 6 week f/u: GAD She was started on Paroxetine and has titrated up to 20mg  QD She has only needed to use Lorazepam 0.5mg  once since filling the rx in early June 2020 She reports almost complete resolution of GAD and states "I fell great, I wish I had done this sooner!". She denies SI/HI She has continued with her therapist [facility] Controlled Substance Database reviewed- No aberrancies noted She has greatly increased her water intake She continues to eat a healthy diet and remains active. She reports nausea and diarrhea this morning, however has none IBS-D and is lactose intolerant and consumed cheese pizza last evening. She denies fever/night sweats/nasal drainage She denies cough/chest heaviness She denies change in smell or taste She denies her husband or child with acute sx's   Patient Care Team    Relationship Specialty Notifications Start End  Esaw Grandchild, NP PCP - General Family Medicine  09/14/17     Patient Active Problem List   Diagnosis Date Noted  . GAD (generalized anxiety disorder) 02/17/2019  . Arthralgia of right temporomandibular joint 09/14/2017  . Healthcare maintenance 09/14/2017     Past Medical History:  Diagnosis Date  . BV (bacterial vaginosis)   . IBS (irritable bowel syndrome)   . Scoliosis   . TB lung, latent   . Vaginal yeast infection      Past Surgical History:  Procedure Laterality Date  . FOOT SURGERY Left    tooth pick removed from bottom of foot     Family History  Problem Relation Age of Onset  . Depression  Mother   . Bipolar disorder Mother   . Heart attack Father        mid 5640s  . Hypertension Father   . Suicidality Paternal Uncle   . Heart disease Paternal Uncle   . Cancer Maternal Grandmother 30       breast  . Cancer Paternal Grandmother        colon  . Hypertension Paternal Grandfather      Social History   Substance and Sexual Activity  Drug Use Yes  . Types: Marijuana     Social History   Substance and Sexual Activity  Alcohol Use Yes  . Alcohol/week: 6.0 standard drinks  . Types: 6 Glasses of wine per week   Comment: rarely     Social History   Tobacco Use  Smoking Status Former Smoker  . Types: Cigarettes  Smokeless Tobacco Never Used     Outpatient Encounter Medications as of 03/28/2019  Medication Sig  . ibuprofen (ADVIL,MOTRIN) 200 MG tablet Take 200-400 mg by mouth every 6 (six) hours as needed. For pain.   Marland Kitchen. levonorgestrel (MIRENA) 20 MCG/24HR IUD 1 each by Intrauterine route once.  . loratadine (CLARITIN) 10 MG tablet Take 10 mg by mouth daily.  Marland Kitchen. LORazepam (ATIVAN) 0.5 MG tablet Take 1 tablet (0.5 mg total) by mouth 2 (two) times daily as needed for anxiety.  Marland Kitchen. PARoxetine (PAXIL) 20 MG tablet 1 tablet daily for one week  . [DISCONTINUED] PARoxetine (PAXIL) 20 MG tablet 1/2 tablet daily for one week, then increase to one full tablet daily   No facility-administered encounter medications on file as of 03/28/2019.     Allergies: Amoxicillin, Lactase, Penicillins, and Septra [bactrim]  There is no height or weight on file to calculate BMI.  Temperature 98.8 F (37.1 C), temperature source Oral, last menstrual period 03/06/2019. Review of Systems: General:   Denies fever, chills, unexplained weight loss.  Optho/Auditory:   Denies visual changes, blurred vision/LOV Respiratory:   Denies SOB, DOE more than baseline levels.  Cardiovascular:   Denies chest pain, palpitations, new onset peripheral edema  Gastrointestinal:   Denies nausea, vomiting,  diarrhea.  Genitourinary: Denies dysuria, freq/ urgency, flank pain or discharge from genitals.  Endocrine:     Denies hot or cold intolerance, polyuria, polydipsia. Musculoskeletal:   Denies unexplained myalgias, joint swelling, unexplained arthralgias, gait problems.  Skin:  Denies rash, suspicious lesions Neurological:     Denies dizziness, unexplained weakness, numbness  Psychiatric/Behavioral:   Denies mood changes, suicidal or homicidal ideations, hallucinations This patient does not have sx concerning for COVID-19 Infection (ie; fever, chills, cough, new or worsening shortness of breath).     Observations/Objective: No acute distress noted during telephone conversation  Assessment and Plan: Continue Paroxetine 20mg  QD [place] Controlled Substance Database reviewed- No aberrancies noted Remain well hydrated, follow Mediterranean diet Continue regular exercise  Continue regular follow-up with therapist If you are concerned about COVID exposure or exhibit COVID sx's- call clinic  Continue to social distance and wear a mask when out in public. Recommend adding shield when  performing treatments at work  Follow Up Instructions: 6 month f/u   I discussed the assessment and treatment plan with the patient. The patient was provided an opportunity to ask questions and all were answered. The patient agreed with the plan and demonstrated an understanding of the instructions.   The patient was advised to call back or seek an in-person evaluation if the symptoms worsen or if the condition fails to improve as anticipated.  I provided 15 minutes of non-face-to-face time during this encounter.   Julaine FusiKaty D Letasha Kershaw, NP

## 2019-03-28 ENCOUNTER — Ambulatory Visit (INDEPENDENT_AMBULATORY_CARE_PROVIDER_SITE_OTHER): Payer: 59 | Admitting: Adult Health

## 2019-03-28 ENCOUNTER — Other Ambulatory Visit: Payer: Self-pay

## 2019-03-28 ENCOUNTER — Encounter: Payer: Self-pay | Admitting: Adult Health

## 2019-03-28 DIAGNOSIS — F411 Generalized anxiety disorder: Secondary | ICD-10-CM

## 2019-03-28 MED ORDER — PAROXETINE HCL 20 MG PO TABS: ORAL_TABLET | ORAL | 1 refills | Status: DC

## 2019-03-28 NOTE — Assessment & Plan Note (Signed)
Assessment and Plan: Continue Paroxetine 20mg  QD [place] Controlled Substance Database reviewed- No aberrancies noted Remain well hydrated, follow Mediterranean diet Continue regular exercise  Continue regular follow-up with therapist If you are concerned about COVID exposure or exhibit COVID sx's- call clinic  Continue to social distance and wear a mask when out in public. Recommend adding shield when performing treatments at work  Follow Up Instructions: 6 month f/u   I discussed the assessment and treatment plan with the patient. The patient was provided an opportunity to ask questions and all were answered. The patient agreed with the plan and demonstrated an understanding of the instructions.   The patient was advised to call back or seek an in-person evaluation if the symptoms worsen or if the condition fails to improve as anticipated.

## 2019-06-26 NOTE — Progress Notes (Signed)
Virtual Visit via Telephone Note  I connected with Bailey [name] on 06/27/19 at  9:45 AM EDT by telephone and verified that I am speaking with the correct person using two identifiers.  Location: Patient: Bailey [name]   I discussed the limitations, risks, security and privacy concerns of performing an evaluation and management service by telephone and the availability of in person appointments. I also discussed with the patient that there may be a patient responsible charge related to this service. The patient expressed understanding and agreed to proceed.   History of Present Illness: 02/17/2019 OV: Bailey [name] calls in today withincrease with anxiety and OCD behavior, ie constantly checking her and her son's temp- up to 4 times/day. She reports constant worry about contracting and subsequently dying from COVID-19. Her father passed away from a MI when she was 53. 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 She wears a mask when performing procedures and she is diligent about hand hygiene. She has been consistent about social distancing and wearing a mask when out in public. She, her child (31 year old son), and her husband are all in excellent health. She denies depression or SI/HI She has reduced ETOH She has reduced exercise due to lack of interest and low energy. She has never been on SSRI or SNRI She reports occasionally using her husband's Alprazolam for acute anxiety. She denies sx's of COVID-19 When she checks her temp, all readings <22f oral 03/28/2019 OV: Bailey [name] calls in today with 6 week f/u: GAD She was started on Paroxetine and has titrated up to 20mg  QD She has only needed to use Lorazepam 0.5mg  once since filling the rx in early June 2020 She reports almost complete resolution of GAD and states "I fell great, I wish I had done this sooner!". She denies SI/HI She has continued  with her therapist [facility] Controlled Substance Database reviewed- No aberrancies noted She has greatly increased her water intake She continues to eat a healthy diet and remains active. She reports nausea and diarrhea this morning, however has none IBS-D and is lactose intolerant and consumed cheese pizza last evening. She denies fever/night sweats/nasal drainage She denies cough/chest heaviness She denies change in smell or taste She denies her husband or child with acute sx's  06/27/2019 OV: Bailey [name] calls in with concerns about sexual dysfunction r/t to SSRI- paroxetine 20mg  QD that she is taking for OCD/GAD, She reports excellent control of anxiety and compulsive sx's, but cannot achieve orgasm and that is an issue. She states "the paroxetine has changed my life for the better". She denies SI/HI. She has continued regular therapy sessions. Discussed changing SSRIs or adding Wellbutrin- she opted for later.  Patient Care Team    Relationship Specialty Notifications Start End  Esaw Grandchild, NP PCP - General Family Medicine  09/14/17     Patient Active Problem List   Diagnosis Date Noted  . GAD (generalized anxiety disorder) 02/17/2019  . Arthralgia of right temporomandibular joint 09/14/2017  . Healthcare maintenance 09/14/2017     Past Medical History:  Diagnosis Date  . BV (bacterial vaginosis)   . IBS (irritable bowel syndrome)   . Scoliosis   . TB lung, latent   . Vaginal yeast infection      Past Surgical History:  Procedure Laterality Date  . FOOT SURGERY Left    tooth pick removed from bottom of foot  Family History  Problem Relation Age of Onset  . Depression Mother   . Bipolar disorder Mother   . Heart attack Father        mid 54s  . Hypertension Father   . Suicidality Paternal Uncle   . Heart disease Paternal Uncle   . Cancer Maternal Grandmother 30       breast  . Cancer Paternal Grandmother        colon  . Hypertension Paternal  Grandfather      Social History   Substance and Sexual Activity  Drug Use Yes  . Types: Marijuana     Social History   Substance and Sexual Activity  Alcohol Use Yes  . Alcohol/week: 6.0 standard drinks  . Types: 6 Glasses of wine per week   Comment: rarely     Social History   Tobacco Use  Smoking Status Former Smoker  . Types: Cigarettes  Smokeless Tobacco Never Used     Outpatient Encounter Medications as of 06/27/2019  Medication Sig  . ibuprofen (ADVIL,MOTRIN) 200 MG tablet Take 200-400 mg by mouth every 6 (six) hours as needed. For pain.   Marland Kitchen levonorgestrel (MIRENA) 20 MCG/24HR IUD 1 each by Intrauterine route once.  . loratadine (CLARITIN) 10 MG tablet Take 10 mg by mouth daily.  Marland Kitchen PARoxetine (PAXIL) 20 MG tablet 1 tablet daily for one week  . [DISCONTINUED] LORazepam (ATIVAN) 0.5 MG tablet Take 1 tablet (0.5 mg total) by mouth 2 (two) times daily as needed for anxiety.  Marland Kitchen buPROPion (WELLBUTRIN SR) 150 MG 12 hr tablet 1 tab daily for 3 days, then increase to 1 tab twice daily- hold at this dose   No facility-administered encounter medications on file as of 06/27/2019.     Allergies: Amoxicillin, Lactase, Penicillins, and Septra [bactrim]  Body mass index is 19.1 kg/m.  Height 5' 10.5" (1.791 m), weight 135 lb (61.2 kg). Review of Systems: General:   Denies fever, chills, unexplained weight loss.  Optho/Auditory:   Denies visual changes, blurred vision/LOV Respiratory:   Denies SOB, DOE more than baseline levels.  Cardiovascular:   Denies chest pain, palpitations, new onset peripheral edema  Gastrointestinal:   Denies nausea, vomiting, diarrhea.  Genitourinary: Denies dysuria, freq/ urgency, flank pain or discharge from genitals.  Endocrine:     Denies hot or cold intolerance, polyuria, polydipsia. Musculoskeletal:   Denies unexplained myalgias, joint swelling, unexplained arthralgias, gait problems.  Skin:  Denies rash, suspicious  lesions Neurological:     Denies dizziness, unexplained weakness, numbness  Psychiatric/Behavioral:   Denies mood changes, suicidal or homicidal ideations, hallucinations. SSRI r/t sexual SE + This patient does not have sx concerning for COVID-19 Infection (ie; fever, chills, cough, new or worsening shortness of breath).   Observations/Objective: No acute distress noted during the telephone conversation  Assessment and Plan: Continue Paroxetine 20mg  QD Start Wellbutrin SR 150mg  QD for 3 days, then increase to BID F/u 4 weeks Continue with therapist  Follow Up Instructions: F/u 4 weeks TeleMedicine    I discussed the assessment and treatment plan with the patient. The patient was provided an opportunity to ask questions and all were answered. The patient agreed with the plan and demonstrated an understanding of the instructions.   The patient was advised to call back or seek an in-person evaluation if the symptoms worsen or if the condition fails to improve as anticipated.  I provided  7 minutes of non-face-to-face time during this encounter.   , NP

## 2019-06-27 ENCOUNTER — Ambulatory Visit (INDEPENDENT_AMBULATORY_CARE_PROVIDER_SITE_OTHER): Payer: 59 | Admitting: Adult Health

## 2019-06-27 ENCOUNTER — Encounter: Payer: Self-pay | Admitting: Adult Health

## 2019-06-27 ENCOUNTER — Other Ambulatory Visit: Payer: Self-pay

## 2019-06-27 DIAGNOSIS — R37 Sexual dysfunction, unspecified: Secondary | ICD-10-CM | POA: Diagnosis not present

## 2019-06-27 MED ORDER — BUPROPION HCL ER (SR) 150 MG PO TB12: ORAL_TABLET | ORAL | 1 refills | Status: DC

## 2019-06-27 NOTE — Assessment & Plan Note (Signed)
Assessment and Plan: Continue Paroxetine 20mg  QD Start Wellbutrin SR 150mg  QD for 3 days, then increase to BID F/u 4 weeks Continue with therapist  Follow Up Instructions: F/u 4 weeks TeleMedicine    I discussed the assessment and treatment plan with the patient. The patient was provided an opportunity to ask questions and all were answered. The patient agreed with the plan and demonstrated an understanding of the instructions.   The patient was advised to call back or seek an in-person evaluation if the symptoms worsen or if the condition fails to improve as anticipated.

## 2019-07-28 ENCOUNTER — Encounter: Payer: Self-pay | Admitting: Adult Health

## 2019-07-28 ENCOUNTER — Ambulatory Visit (INDEPENDENT_AMBULATORY_CARE_PROVIDER_SITE_OTHER): Payer: 59 | Admitting: Adult Health

## 2019-07-28 ENCOUNTER — Other Ambulatory Visit: Payer: Self-pay

## 2019-07-28 DIAGNOSIS — R37 Sexual dysfunction, unspecified: Secondary | ICD-10-CM | POA: Diagnosis not present

## 2019-07-28 DIAGNOSIS — F411 Generalized anxiety disorder: Secondary | ICD-10-CM | POA: Diagnosis not present

## 2019-07-28 MED ORDER — BUPROPION HCL ER (SR) 150 MG PO TB12: ORAL_TABLET | ORAL | 1 refills | Status: DC

## 2019-07-28 MED ORDER — PAROXETINE HCL 20 MG PO TABS: ORAL_TABLET | ORAL | 1 refills | Status: DC

## 2019-07-28 NOTE — Progress Notes (Signed)
Virtual Visit via Telephone Note  I connected with Bailey [name] on 07/28/19 at  3:15 PM EST by telephone and verified that I am speaking with the correct person using two identifiers.  Location: Patient: Home Provider: In Clinic   I discussed the limitations, risks, security and privacy concerns of performing an evaluation and management service by telephone and the availability of in person appointments. I also discussed with the patient that there may be a patient responsible charge related to this service. The patient expressed understanding and agreed to proceed.   History of Present Illness: 03/28/2019 OV: Bailey [name] calls in todaywith 6 week f/u: GAD She was started on Paroxetine and has titrated up to 20mg  QD She has only needed to use Lorazepam 0.5mg  once since filling the rx in early June 2020 She reports almost complete resolution of GAD and states "I fell great, I wish I had done this sooner!". She denies SI/HI She has continued with her therapist [facility] Controlled Substance Database reviewed- No aberrancies noted She has greatly increased her water intake She continues to eat a healthy diet and remains active. She reports nausea and diarrhea this morning, however has none IBS-D and is lactose intolerant and consumed cheese pizza last evening. She denies fever/night sweats/nasal drainage She denies cough/chest heaviness She denies change in smell or taste She denies her husband or child with acute sx's 06/27/2019 OV: Bailey [name] calls in with concerns about sexual dysfunction r/t to SSRI- paroxetine 20mg  QD that she is taking for OCD/GAD, She reports excellent control of anxiety and compulsive sx's, but cannot achieve orgasm and that is an issue. She states "the paroxetine has changed my life for the better". She denies SI/HI. She has continued regular therapy sessions. Discussed changing SSRIs or adding Wellbutrin- she opted for later. 07/28/2019 OV: Bailey [name]  calls in for f/u: Sexual dysfunction r/t SSRI- Paroxetine 20mg   Added Wellbutrin 150mg  BID She reports resolution of sexual SE, states "no problems these anymore". She reports excellent mood, denies SI/HI She reports occasional dry mouth- encouraged to drink more water. 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 She reports improved focus and concentration on the Wellbutrin 150mg  BID Patient Care Team    Relationship Specialty Notifications Start End  07/30/2019, NP PCP - General Family Medicine  09/14/17     Patient Active Problem List   Diagnosis Date Noted  . Sexual dysfunction 06/27/2019  . GAD (generalized anxiety disorder) 02/17/2019  . Arthralgia of right temporomandibular joint 09/14/2017  . Healthcare maintenance 09/14/2017     Past Medical History:  Diagnosis Date  . BV (bacterial vaginosis)   . IBS (irritable bowel syndrome)   . Scoliosis   . TB lung, latent   . Vaginal yeast infection      Past Surgical History:  Procedure Laterality Date  . FOOT SURGERY Left    tooth pick removed from bottom of foot     Family History  Problem Relation Age of Onset  . Depression Mother   . Bipolar disorder Mother   . Heart attack Father        mid 14s  . Hypertension Father   . Suicidality Paternal Uncle   . Heart disease Paternal Uncle   . Cancer Maternal Grandmother 30       breast  . Cancer Paternal Grandmother  colon  . Hypertension Paternal Grandfather      Social History   Substance and Sexual Activity  Drug Use Yes  . Types: Marijuana     Social History   Substance and Sexual Activity  Alcohol Use Yes  . Alcohol/week: 6.0 standard drinks  . Types: 6 Glasses of wine per week   Comment: rarely     Social History   Tobacco Use  Smoking Status Former Smoker  . Types: Cigarettes  Smokeless Tobacco Never Used     Outpatient Encounter  Medications as of 07/28/2019  Medication Sig  . buPROPion (WELLBUTRIN SR) 150 MG 12 hr tablet 1 tab daily for 3 days, then increase to 1 tab twice daily- hold at this dose  . ibuprofen (ADVIL,MOTRIN) 200 MG tablet Take 200-400 mg by mouth every 6 (six) hours as needed. For pain.   Marland Kitchen levonorgestrel (MIRENA) 20 MCG/24HR IUD 1 each by Intrauterine route once.  . loratadine (CLARITIN) 10 MG tablet Take 10 mg by mouth daily.  Marland Kitchen PARoxetine (PAXIL) 20 MG tablet 1 tablet daily for one week   No facility-administered encounter medications on file as of 07/28/2019.     Allergies: Amoxicillin, Lactase, Penicillins, and Septra [bactrim]  Body mass index is 19.24 kg/m.  Height 5' 10.5" (1.791 m), weight 136 lb (61.7 kg), last menstrual period 07/24/2019. Review of Systems: General:   Denies fever, chills, unexplained weight loss.  Optho/Auditory:   Denies visual changes, blurred vision/LOV Respiratory:   Denies SOB, DOE more than baseline levels.  Cardiovascular:   Denies chest pain, palpitations, new onset peripheral edema  Gastrointestinal:   Denies nausea, vomiting, diarrhea.  Genitourinary: Denies dysuria, freq/ urgency, flank pain or discharge from genitals.  Endocrine:     Denies hot or cold intolerance, polyuria, polydipsia. Musculoskeletal:   Denies unexplained myalgias, joint swelling, unexplained arthralgias, gait problems.  Skin:  Denies rash, suspicious lesions Neurological:     Denies dizziness, unexplained weakness, numbness  Psychiatric/Behavioral:   Denies mood changes, suicidal or homicidal ideations, hallucinations    Observations/Objective: No acute distress noted during the telephone conversation  Assessment and Plan: Continue Paroxetine 20mg   Added Wellbutrin 150mg  BID Increase regular exercise, continue healthy eating, increase water intake  Follow Up Instructions: PRN   I discussed the assessment and treatment plan with the patient. The patient was provided an  opportunity to ask questions and all were answered. The patient agreed with the plan and demonstrated an understanding of the instructions.   The patient was advised to call back or seek an in-person evaluation if the symptoms worsen or if the condition fails to improve as anticipated.  I provided 8 minutes of non-face-to-face time during this encounter.   Esaw Grandchild, NP

## 2019-07-29 NOTE — Assessment & Plan Note (Signed)
Assessment and Plan: Continue Paroxetine 20mg   Added Wellbutrin 150mg  BID Increase regular exercise, continue healthy eating, increase water intake  Follow Up Instructions: PRN   I discussed the assessment and treatment plan with the patient. The patient was provided an opportunity to ask questions and all were answered. The patient agreed with the plan and demonstrated an understanding of the instructions.   The patient was advised to call back or seek an in-person evaluation if the symptoms worsen or if the condition fails to improve as anticipated.

## 2019-07-29 NOTE — Assessment & Plan Note (Signed)
Resolved with addition of Wellbutrin 150mg  BID

## 2019-09-20 ENCOUNTER — Ambulatory Visit: Payer: 59 | Admitting: Adult Health

## 2019-10-05 ENCOUNTER — Ambulatory Visit: Payer: 59 | Attending: Internal Medicine

## 2019-10-05 DIAGNOSIS — Z20822 Contact with and (suspected) exposure to covid-19: Secondary | ICD-10-CM

## 2019-10-06 LAB — NOVEL CORONAVIRUS, NAA: SARS-CoV-2, NAA: NOT DETECTED

## 2019-10-18 ENCOUNTER — Other Ambulatory Visit: Payer: Self-pay | Admitting: Adult Health

## 2020-01-08 ENCOUNTER — Other Ambulatory Visit: Payer: Self-pay

## 2020-01-08 ENCOUNTER — Emergency Department (HOSPITAL_COMMUNITY): Payer: 59

## 2020-01-08 ENCOUNTER — Emergency Department (HOSPITAL_COMMUNITY)
Admission: EM | Admit: 2020-01-08 | Discharge: 2020-01-08 | Disposition: A | Discharge status: Home / Self Care | Payer: 59 | Attending: Emergency Medicine | Admitting: Emergency Medicine

## 2020-01-08 DIAGNOSIS — S59912A Unspecified injury of left forearm, initial encounter: Secondary | ICD-10-CM | POA: Diagnosis present

## 2020-01-08 DIAGNOSIS — S52335A Nondisplaced oblique fracture of shaft of left radius, initial encounter for closed fracture: Secondary | ICD-10-CM | POA: Insufficient documentation

## 2020-01-08 DIAGNOSIS — Y999 Unspecified external cause status: Secondary | ICD-10-CM | POA: Insufficient documentation

## 2020-01-08 DIAGNOSIS — Y929 Unspecified place or not applicable: Secondary | ICD-10-CM | POA: Diagnosis not present

## 2020-01-08 DIAGNOSIS — Z79899 Other long term (current) drug therapy: Secondary | ICD-10-CM | POA: Diagnosis not present

## 2020-01-08 DIAGNOSIS — W500XXA Accidental hit or strike by another person, initial encounter: Secondary | ICD-10-CM | POA: Insufficient documentation

## 2020-01-08 DIAGNOSIS — Y9389 Activity, other specified: Secondary | ICD-10-CM | POA: Diagnosis not present

## 2020-01-08 DIAGNOSIS — Z87891 Personal history of nicotine dependence: Secondary | ICD-10-CM | POA: Insufficient documentation

## 2020-01-08 MED ORDER — MORPHINE SULFATE 15 MG PO TABS: 7.5000 mg | ORAL_TABLET | ORAL | 0 refills | Status: DC | PRN

## 2020-01-08 MED ORDER — ACETAMINOPHEN 500 MG PO TABS
1000.0000 mg | ORAL_TABLET | Freq: Once | ORAL | Status: AC
  Administered 2020-01-08: 1000 mg via ORAL
  Filled 2020-01-08: qty 2

## 2020-01-08 MED ORDER — OXYCODONE HCL 5 MG PO TABS
5.0000 mg | ORAL_TABLET | Freq: Once | ORAL | Status: AC
  Administered 2020-01-08: 5 mg via ORAL
  Filled 2020-01-08: qty 1

## 2020-01-08 NOTE — ED Provider Notes (Signed)
Bailey [name]   CSN: [id number] Arrival date & time: 01/08/20  2015     History Chief Complaint  Patient presents with  . Left Arm Injury    Bailey [name] is a 29 y.o. female.  29 yo F with a chief complaints of left forearm pain.  Patient states that she was sitting on the floor and her son and a friend were playing and they fell and landed on her forearm.  She felt like there was a crunch and had significant pain afterwards.  Denies any other area of injury.  Pain with palpation and movement.  The history is provided by the patient.  Injury This is a new problem. The current episode started 2 days ago. The problem occurs constantly. The problem has not changed since onset.Pertinent negatives include no chest pain, no headaches and no shortness of breath. Nothing aggravates the symptoms. Nothing relieves the symptoms. She has tried nothing for the symptoms. The treatment provided no relief.       Past Medical History:  Diagnosis Date  . BV (bacterial vaginosis)   . IBS (irritable bowel syndrome)   . Scoliosis   . TB lung, latent   . Vaginal yeast infection     Patient Active Problem List   Diagnosis Date Noted  . Sexual dysfunction 06/27/2019  . GAD (generalized anxiety disorder) 02/17/2019  . Arthralgia of right temporomandibular joint 09/14/2017  . Healthcare maintenance 09/14/2017    Past Surgical History:  Procedure Laterality Date  . FOOT SURGERY Left    tooth pick removed from bottom of foot     OB History   No obstetric history on file.     Family History  Problem Relation Age of Onset  . Depression Mother   . Bipolar disorder Mother   . Heart attack Father        mid 4s  . Hypertension Father   . Suicidality Paternal Uncle   . Heart disease Paternal Uncle   . Cancer Maternal Grandmother 30       breast  . Cancer Paternal Grandmother        colon  . Hypertension Paternal Grandfather      Social History   Tobacco Use  . Smoking status: Former Smoker    Types: Cigarettes  . Smokeless tobacco: Never Used  Substance Use Topics  . Alcohol use: Yes    Alcohol/week: 6.0 standard drinks    Types: 6 Glasses of wine per week    Comment: rarely  . Drug use: Yes    Types: Marijuana    Home Medications Prior to Admission medications   Medication Sig Start Date End Date Taking? Authorizing Provider  buPROPion (WELLBUTRIN SR) 150 MG 12 hr tablet TAKE 1 TABLET BY MOUTH EVERY 12 HOURS 10/18/19   Danford, Orpha Bur D, NP  ibuprofen (ADVIL,MOTRIN) 200 MG tablet Take 200-400 mg by mouth every 6 (six) hours as needed. For pain.     [provider]  levonorgestrel (MIRENA) 20 MCG/24HR IUD 1 each by Intrauterine route once.    [provider]  loratadine (CLARITIN) 10 MG tablet Take 10 mg by mouth daily.    [provider]  morphine (MSIR) 15 MG tablet Take 0.5 tablets (7.5 mg total) by mouth every 4 (four) hours as needed for severe pain. 01/08/20   Melene Plan, DO  PARoxetine (PAXIL) 20 MG tablet TAKE 1 TABLET BY MOUTH ONCE DAILY 10/18/19   Danford, Jinny Blossom, NP  Allergies    Amoxicillin, Lactase, Penicillins, and Septra [bactrim]  Review of Systems   Review of Systems  Constitutional: Negative for chills and fever.  HENT: Negative for congestion and rhinorrhea.   Eyes: Negative for redness and visual disturbance.  Respiratory: Negative for shortness of breath and wheezing.   Cardiovascular: Negative for chest pain and palpitations.  Gastrointestinal: Negative for nausea and vomiting.  Genitourinary: Negative for dysuria and urgency.  Musculoskeletal: Positive for myalgias. Negative for arthralgias.  Skin: Negative for pallor and wound.  Neurological: Negative for dizziness and headaches.    Physical Exam Updated Vital Signs BP (!) 149/90 (BP Location: Right Arm)   Temp 99.6 F (37.6 C) (Oral)   Resp 16   Ht 5\' 10"  (1.778 m)   Wt 63.5 kg   LMP  01/04/2020 (Approximate)   SpO2 100%   BMI 20.09 kg/m   Physical Exam Vitals and nursing [name] reviewed.  Constitutional:      General: She is not in acute distress.    Appearance: She is well-developed. She is not diaphoretic.  HENT:     Head: Normocephalic and atraumatic.  Eyes:     Pupils: Pupils are equal, round, and reactive to light.  Cardiovascular:     Rate and Rhythm: Normal rate and regular rhythm.     Heart sounds: No murmur. No friction rub. No gallop.   Pulmonary:     Effort: Pulmonary effort is normal.     Breath sounds: No wheezing or rales.  Abdominal:     General: There is no distension.     Palpations: Abdomen is soft.     Tenderness: There is no abdominal tenderness.  Musculoskeletal:        General: Tenderness present.     Cervical back: Normal range of motion and neck supple.     Comments: Tenderness about the distal third of the radius and ulna.  Mild edema.  No significant pain about the elbow or the wrist.  Full ROM of the wrist and elbow without pain.  No snuffbox tenderness.  No bony tenderness to the hand.   Skin:    General: Skin is warm and dry.  Neurological:     Mental Status: She is alert and oriented to person, place, and time.  Psychiatric:        Behavior: Behavior normal.     ED Results / Procedures / Treatments   Labs (all labs ordered are listed, but only abnormal results are displayed) Labs Reviewed - No data to display  EKG None  Radiology DG Forearm Left  Result Date: 01/08/2020 CLINICAL DATA:  Pain EXAM: LEFT FOREARM - 2 VIEW COMPARISON:  None. FINDINGS: There is an acute, nondisplaced butterfly fracture of the mid radial diaphysis. There is surrounding soft tissue swelling. There is no fracture identified involving the ulna. There is no evidence for an elbow joint effusion. IMPRESSION: Acute, nondisplaced butterfly fracture of the mid radial diaphysis. Electronically Signed   By: 03/09/2020 M.D.   On: 01/08/2020 21:13    DG Wrist Complete Left  Result Date: 01/08/2020 CLINICAL DATA:  Pain EXAM: LEFT WRIST - COMPLETE 3+ VIEW COMPARISON:  None. FINDINGS: There is no evidence of fracture or dislocation. There is no evidence of arthropathy or other focal bone abnormality. Soft tissues are unremarkable. IMPRESSION: Negative. Electronically Signed   By: 03/09/2020 M.D.   On: 01/08/2020 21:13    Procedures Procedures (including critical care time)  Medications Ordered in ED Medications  acetaminophen (TYLENOL) tablet 1,000 mg (1,000 mg Oral Given 01/08/20 2100)  oxyCODONE (Oxy IR/ROXICODONE) immediate release tablet 5 mg (5 mg Oral Given 01/08/20 2100)    ED Course  I have reviewed the triage vital signs and the nursing notes.  Pertinent labs & imaging results that were available during my care of the patient were reviewed by me and considered in my medical decision making (see chart for details).    MDM Rules/Calculators/A&P                      29 yo F with a chief complaints of a left forearm pain.  This occurred with a direct blow.  Will obtain a plain film.  Plain film viewed by me with a butterfly fragment of the left midshaft radius.  Will place in a sugar tong.  Orthopedic follow-up.  9:50 PM:  I have discussed the diagnosis/risks/treatment options with the patient and believe the pt to be eligible for discharge home to follow-up with Ortho. We also discussed returning to the ED immediately if new or worsening sx occur. We discussed the sx which are most concerning (e.g., sudden worsening pain, fever, inability to tolerate by mouth) that necessitate immediate return. Medications administered to the patient during their visit and any new prescriptions provided to the patient are listed below.  Medications given during this visit Medications  acetaminophen (TYLENOL) tablet 1,000 mg (1,000 mg Oral Given 01/08/20 2100)  oxyCODONE (Oxy IR/ROXICODONE) immediate release tablet 5 mg (5 mg Oral Given  01/08/20 2100)     The patient appears reasonably screen and/or stabilized for discharge and I doubt any other medical condition or other [facility] requiring further screening, evaluation, or treatment in the ED at this time prior to discharge.   Final Clinical Impression(s) / ED Diagnoses Final diagnoses:  Nondisplaced oblique fracture of shaft of left radius, initial encounter for closed fracture    Rx / DC Orders ED Discharge Orders         Ordered    morphine (MSIR) 15 MG tablet  Every 4 hours PRN     01/08/20 2123           Deno Etienne, DO 01/08/20 2151

## 2020-01-08 NOTE — ED Notes (Signed)
Pt verbalizes understanding of DC instructions. Pt belongings returned and is ambulatory out of ED.  

## 2020-01-08 NOTE — ED Triage Notes (Signed)
Pt arrived ambulatory into ED from home CC left arm injury. Pt states " I was playing with my son and my bf and he tripped and fell on my arm. It hurts to move it".   Pt holding arm in comfortable position.Pt denies numbness or tingling. Extremity warm to the touch.    VSS Afebrile. Sling provided

## 2020-01-08 NOTE — Discharge Instructions (Addendum)
Follow up with ortho in the office.  Return for worsening pain.   Take 4 over the counter ibuprofen tablets 3 times a day or 2 over-the-counter naproxen tablets twice a day for pain. Also take tylenol 1000mg (2 extra strength) four times a day.   Then take the pain medicine if you feel like you need it. Narcotics do not help with the pain, they only make you care about it less.  You can become addicted to this, people may break into your house to steal it.  It will constipate you.  If you drive under the influence of this medicine you can get a DUI.

## 2020-01-08 NOTE — ED Notes (Signed)
Called ortho tech 

## 2020-01-25 ENCOUNTER — Other Ambulatory Visit: Payer: Self-pay | Admitting: Physician Assistant

## 2020-01-25 ENCOUNTER — Telehealth: Payer: Self-pay | Admitting: General Practice

## 2020-01-25 DIAGNOSIS — F411 Generalized anxiety disorder: Secondary | ICD-10-CM

## 2020-01-25 MED ORDER — PAROXETINE HCL 20 MG PO TABS: ORAL_TABLET | ORAL | 0 refills | Status: DC

## 2020-01-25 NOTE — Telephone Encounter (Signed)
Patient called after speaking with her OBGYN, she is wanting to lower her dosage of her Paxil and eventually come off completely. She has had significant life changes for the better as the reason she wants to try to come off this med slowly. She states between COVID slowing down and a long term unhealthy relationship that has finally ended, she feels in a better place and thinks she can come off this med. Her OBGYN suggested she contact our office to see if this is something we can do for her.  She is out of this med and before requesting a refill wanted to contact us about this dosage change, and if approved please send refill to [facility].

## 2020-01-25 NOTE — Telephone Encounter (Signed)
Patient took last pill this morning. Is requesting guidance on how to taper off med. Please advise. AS, CMA

## 2020-01-25 NOTE — Telephone Encounter (Signed)
Tapering instructions: take 1 tablet (20 mg) by mouth x 1 wk, then take 1/2 tablet (10 mg) x 1 wk, then take 1/2 tablet every other day. Will send in rx to [facility].  Thank you, Kandis Cocking

## 2020-01-25 NOTE — Telephone Encounter (Signed)
Patient is aware of the below and verbalized understanding. AS, CMA 

## 2020-02-15 DIAGNOSIS — Z8619 Personal history of other infectious and parasitic diseases: Secondary | ICD-10-CM | POA: Insufficient documentation

## 2020-03-29 ENCOUNTER — Other Ambulatory Visit: Payer: Self-pay | Admitting: Adult Health

## 2020-03-30 ENCOUNTER — Other Ambulatory Visit: Payer: Self-pay | Admitting: Physician Assistant

## 2020-04-03 ENCOUNTER — Telehealth: Payer: Self-pay | Admitting: Physician Assistant

## 2020-04-03 MED ORDER — BUPROPION HCL ER (SR) 150 MG PO TB12: 150.0000 mg | ORAL_TABLET | Freq: Every day | ORAL | 0 refills | Status: DC

## 2020-04-03 NOTE — Telephone Encounter (Signed)
Per Kandis Cocking giving a 30 day supply of Wellbutrin but only to take once daily since that's how patient has been taking in the past.   Left message for patient to call back. AS, CMA

## 2020-04-03 NOTE — Addendum Note (Signed)
Addended by: Sylvester Harder on: 04/03/2020 03:58 PM   Modules accepted: Orders

## 2020-04-03 NOTE — Telephone Encounter (Signed)
Patient is requesting a refill of her Wellbutrin, if approved please send to [facility]

## 2020-04-03 NOTE — Telephone Encounter (Signed)
Patient last seen 11/20 and was last given refill of Wellbutrin 2/21 for 90 day supply.   Patient should have been out of medication since May but was taking differently than advised.   Patient states she just ran out 03/30/20 and has been taking medication 1 x daily.    Patient advised she needs an apt for further refills but request a short term supply until apt date.   Please approve if refill deemed appropriate.   Patient has apt scheduled 05/08/20. AS, CMA

## 2020-04-04 NOTE — Telephone Encounter (Signed)
Left message for patient to call back. AS, CMA 

## 2020-04-05 NOTE — Telephone Encounter (Signed)
Patient is aware of the below. AS, CMA 

## 2020-04-05 NOTE — Telephone Encounter (Signed)
Left message for patient to call back. AS, CMA 

## 2020-05-08 ENCOUNTER — Encounter: Payer: Self-pay | Admitting: Physician Assistant

## 2020-05-08 ENCOUNTER — Ambulatory Visit (INDEPENDENT_AMBULATORY_CARE_PROVIDER_SITE_OTHER): Payer: 59 | Admitting: Physician Assistant

## 2020-05-08 ENCOUNTER — Other Ambulatory Visit: Payer: Self-pay

## 2020-05-08 VITALS — Ht 70.0 in | Wt 140.0 lb

## 2020-05-08 DIAGNOSIS — F32A Depression, unspecified: Secondary | ICD-10-CM

## 2020-05-08 DIAGNOSIS — F411 Generalized anxiety disorder: Secondary | ICD-10-CM

## 2020-05-08 DIAGNOSIS — F329 Major depressive disorder, single episode, unspecified: Secondary | ICD-10-CM

## 2020-05-08 MED ORDER — BUPROPION HCL ER (XL) 300 MG PO TB24: 300.0000 mg | ORAL_TABLET | Freq: Every day | ORAL | 2 refills | Status: DC
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
## 2020-07-02 ENCOUNTER — Encounter: Payer: Self-pay | Admitting: Physician Assistant

## 2020-07-02 ENCOUNTER — Ambulatory Visit (INDEPENDENT_AMBULATORY_CARE_PROVIDER_SITE_OTHER): Payer: 59 | Admitting: Physician Assistant

## 2020-07-02 ENCOUNTER — Other Ambulatory Visit: Payer: Self-pay

## 2020-07-02 DIAGNOSIS — F411 Generalized anxiety disorder: Secondary | ICD-10-CM

## 2020-07-02 DIAGNOSIS — F32A Depression, unspecified: Secondary | ICD-10-CM

## 2020-07-02 MED ORDER — BUPROPION HCL ER (XL) 300 MG PO TB24: 300.0000 mg | ORAL_TABLET | Freq: Every day | ORAL | 0 refills | Status: DC

## 2020-07-02 NOTE — Progress Notes (Signed)
Telehealth office visit note for Bailey Masker, PA-C- at Primary Care at [facility]   I connected with current patient today by telephone and verified that I am speaking with the correct person   . Location of the patient: Home . Location of the provider: Office - This visit type was conducted due to national recommendations for restrictions regarding the COVID-19 Pandemic (e.g. social distancing) in an effort to limit this patient's exposure and mitigate transmission in our community.    - No physical exam could be performed with this format, beyond that communicated to [place] by the patient/ family members as noted.   - Additionally my office staff/ schedulers were to discuss with the patient that there may be a monetary charge related to this service, depending on their medical insurance.  My understanding is that patient understood and consented to proceed.     _________________________________________________________________________________   History of Present Illness: Patient calls in to follow up on mood management.  Patient reports when starting medication she felt like her symptoms were worse but gradually improved.  Feels like she has done well the last couple of weeks.  Medication has helped with focus and concentration.  Encounters with her soon ex-husband trigger her symptoms and make her feel disassociated and in a " head-space." Continues behavioral health therapy sessions and has started to incorporate cognitive behavioral therapy when she has those encounters. Denies SI/HI.     GAD 7 : Generalized Anxiety Score 05/08/2020 03/28/2019 02/17/2019  Nervous, Anxious, on Edge 3 1 3   Control/stop worrying 2 1 3   Worry too much - different things 1 1 0  Trouble relaxing 0 1 2  Restless 0 1 0  Easily annoyed or irritable 1 1 2   Afraid - awful might happen 1 1 3   Total GAD 7 Score 8 7 13   Anxiety Difficulty Somewhat difficult Not difficult at all Very difficult     Depression screen [facility] 2/9 07/02/2020 05/08/2020 06/27/2019 03/28/2019 02/17/2019  Decreased Interest 0 1 0 0 2  Down, Depressed, Hopeless 1 1 1 1 1   PHQ - 2 Score 1 2 1 1 3   Altered sleeping 0 0 1 1 1   Tired, decreased energy 1 2 1 1 3   Change in appetite 0 0 0 1 0  Feeling bad or failure about yourself  1 1 0 0 0  Trouble concentrating 0 3 0 0 3  Moving slowly or fidgety/restless 0 0 0 0 0  Suicidal thoughts 0 0 0 0 0  PHQ-9 Score 3 8 3 4 10   Difficult doing work/chores Somewhat difficult Somewhat difficult Not difficult at all Not difficult at all Very difficult      Impression and Recommendations:      1. GAD (generalized anxiety disorder)   2. Depression, unspecified depression type     Depression, GAD: -PHQ-9 score of 3 which has improved from prior (score of 8). -Symptoms have improved so will continue current medication regimen. -Praised patient for incorporating CBT and recommend to continue with therapy sessions. -Will continue to monitor.    - As part of my medical decision making, I reviewed the following data within the electronic MEDICAL RECORD NUMBER History obtained from pt /family, CMA notes reviewed and incorporated if applicable, Labs reviewed, Radiograph/ tests reviewed if applicable and OV notes from prior OV's with me, as well as any other specialists she/he has seen since seeing me last, were all reviewed and used in my medical  decision making process today.    - Additionally, when appropriate, discussion had with patient regarding our treatment plan, and their biases/concerns about that plan were used in my medical decision making today.    - The patient agreed with the plan and demonstrated an understanding of the instructions.   No barriers to understanding were identified.     - The patient was advised to call back or seek an in-person evaluation if the symptoms worsen or if the condition fails to improve as anticipated.   Return in about 3 months  (around 10/02/2020) for Mood.    No orders of the defined types were placed in this encounter.   Meds ordered this encounter  Medications  . buPROPion (WELLBUTRIN XL) 300 MG 24 hr tablet    Sig: Take 1 tablet (300 mg total) by mouth daily.    Dispense:  90 tablet    Refill:  0    Order Specific Question:   Supervising Provider    Answer:   Nani Gasser D [[id number]]    Medications Discontinued During This Encounter  Medication Reason  . loratadine (CLARITIN) 10 MG tablet   . buPROPion (WELLBUTRIN XL) 300 MG 24 hr tablet Reorder       Time spent on visit including pre-visit chart review and post-visit care was 12 minutes.      The 21st Century Cures Act was signed into law in 2016 which includes the topic of electronic health records.  This provides immediate access to information in MyChart.  This includes consultation notes, operative notes, office notes, lab results and pathology reports.  If you have any questions about what you read please let us know at your next visit or call us at the office.  We are right here with you.  Note:  This note was prepared with assistance of Dragon voice recognition software. Occasional wrong-word or sound-a-like substitutions may have occurred due to the inherent limitations of voice recognition software.  __________________________________________________________________________________     Patient Care Team    Relationship Specialty Notifications Start End  Bailey [name], [place] PCP - General Physician Assistant  01/25/20      -Vitals obtained; medications/ allergies reconciled;  personal medical, social, Sx etc.histories were updated by CMA, reviewed by me and are reflected in chart   Patient Active Problem List   Diagnosis Date Noted  . History of positive PCR for herpes simplex virus type 1 (HSV-1) DNA 02/15/2020  . Sexual dysfunction 06/27/2019  . GAD (generalized anxiety disorder) 02/17/2019  . Arthralgia of right  temporomandibular joint 09/14/2017  . Healthcare maintenance 09/14/2017     Current Meds  Medication Sig  . buPROPion (WELLBUTRIN XL) 300 MG 24 hr tablet Take 1 tablet (300 mg total) by mouth daily.  Marland Kitchen ibuprofen (ADVIL,MOTRIN) 200 MG tablet Take 200-400 mg by mouth every 6 (six) hours as needed. For pain.   Marland Kitchen levonorgestrel (MIRENA) 20 MCG/24HR IUD 1 each by Intrauterine route once.   . [DISCONTINUED] buPROPion (WELLBUTRIN XL) 300 MG 24 hr tablet Take 1 tablet (300 mg total) by mouth daily.     Allergies:  Allergies  Allergen Reactions  . Sulfa Antibiotics Hives  . Amoxicillin Swelling  . Lactase     Other reaction(s): GI Upset (intolerance)  . Penicillins Hives  . Septra [Bactrim] Hives     ROS:  See above HPI for pertinent positives and negatives   Objective:   Height 5\' 10"  (1.778 m), weight 136 lb (61.7 kg).  (if some  vitals are omitted, this means that patient was UNABLE to obtain them even though they were asked to get them prior to OV today.  They were asked to call us at their earliest convenience with these once obtained. ) General: A & O * 3; sounds in no acute distress; Respiratory: speaking in full sentences, no conversational dyspnea Psych: insight appears good, mood- appears full

## 2021-04-23 ENCOUNTER — Telehealth: Payer: Self-pay | Admitting: Physician Assistant

## 2021-04-23 NOTE — Telephone Encounter (Signed)
Patient states she has over 20 lesions on her arms that are extremely itchy. I advised patient that we are unable to do testing in our office at this time and suggested she contact the [facility]. Patient verbalized understanding. AS, CMA

## 2021-04-23 NOTE — Telephone Encounter (Signed)
Patient called into the office to see if we do Monkey Pox testing.  Patient and 4 other friends were at a party over the weekend and now all of them have red bumps all over there arms.  Please advise if we can test for Monkey Pox Please call [facility] at [phone].

## 2021-11-25 ENCOUNTER — Encounter: Payer: Self-pay | Admitting: Physician Assistant

## 2021-12-26 ENCOUNTER — Ambulatory Visit
Admission: EM | Admit: 2021-12-26 | Discharge: 2021-12-26 | Disposition: A | Discharge status: Home / Self Care | Payer: 59 | Attending: Urgent Care | Admitting: Urgent Care

## 2021-12-26 DIAGNOSIS — J019 Acute sinusitis, unspecified: Secondary | ICD-10-CM

## 2021-12-26 DIAGNOSIS — B9789 Other viral agents as the cause of diseases classified elsewhere: Secondary | ICD-10-CM

## 2021-12-26 MED ORDER — FLUTICASONE PROPIONATE 50 MCG/ACT NA SUSP: 1.0000 | Freq: Every day | NASAL | 0 refills | Status: AC

## 2021-12-26 MED ORDER — MONTELUKAST SODIUM 10 MG PO TABS: 10.0000 mg | ORAL_TABLET | Freq: Every day | ORAL | 0 refills | Status: AC

## 2021-12-26 MED ORDER — PREDNISONE 50 MG PO TABS: 50.0000 mg | ORAL_TABLET | Freq: Every day | ORAL | 0 refills | Status: AC

## 2021-12-26 NOTE — ED Triage Notes (Signed)
2 day h/o runny nose, congestion, sinus pressure and onset this morning of productive cough. Notes some ear pain and fatigue. Denies sore throat, v/d. Has been taking advil and mucinex. Negative at home covid test.  ?

## 2021-12-26 NOTE — Discharge Instructions (Addendum)
Your symptoms today are likely virally or allergically mediated. ?Purchase a warm mist vaporizer for use at nighttime, or use steam from a shower. ?Plain guaifenesin or Mucinex may be beneficial to help loosen the phlegm. ?Please start using the nasal spray prescribed today, 1-2 times daily per nostril.  Stop use if nasal bleeding occurs ?Start taking the prednisone as prescribed, best taken in the morning to prevent insomnia at night.  ?Take the montelukast at night as it can make you sleepy. ?Monitor for fever, severe pain in sinuses, or any worsening symptoms ?

## 2021-12-26 NOTE — ED Provider Notes (Signed)
?[facility] ? ? ? ?CSN: [id number] ?Arrival date & time: 12/26/21  1145 ? ? ?  ? ?History   ?Chief Complaint ?Chief Complaint  ?Patient presents with  ? Nasal Congestion  ? ? ?HPI ?Bailey [name] is a 31 y.o. female.  ? ?Pleasant 31 year old female with a known history of severe seasonal allergies presents today with a 2-day history of sinus pain and pressure.  She reports significant nasal congestion and maxillary sinus discomfort.  She denies pain in her teeth or fever.  She endorses postnasal drainage and states that she coughed up thick green-brown phlegm x1 episode this morning.  She denies shortness of breath or chest pain.  She has been using [employer] and Claritin since symptoms started.  ? ? ? ?Past Medical History:  ?Diagnosis Date  ? BV (bacterial vaginosis)   ? IBS (irritable bowel syndrome)   ? Scoliosis   ? TB lung, latent   ? Vaginal yeast infection   ? ? ?Patient Active Problem List  ? Diagnosis Date Noted  ? History of positive PCR for herpes simplex virus type 1 (HSV-1) DNA 02/15/2020  ? Sexual dysfunction 06/27/2019  ? GAD (generalized anxiety disorder) 02/17/2019  ? Arthralgia of right temporomandibular joint 09/14/2017  ? Healthcare maintenance 09/14/2017  ? ? ?Past Surgical History:  ?Procedure Laterality Date  ? FOOT SURGERY Left   ? tooth pick removed from bottom of foot  ? ? ?OB History   ?No obstetric history on file. ?  ? ? ? ?Home Medications   ? ?Prior to Admission medications   ?Medication Sig Start Date End Date Taking? Authorizing Provider  ?fluticasone (FLONASE) 50 MCG/ACT nasal spray Place 1 spray into both nostrils daily. 12/26/21  Yes Kenndra Morris L, PA  ?montelukast (SINGULAIR) 10 MG tablet Take 1 tablet (10 mg total) by mouth at bedtime. 12/26/21  Yes Jeston Junkins L, PA  ?predniSONE (DELTASONE) 50 MG tablet Take 1 tablet (50 mg total) by mouth daily with breakfast for 3 days. 12/26/21 12/29/21 Yes Aveena Bari L, PA  ?ibuprofen (ADVIL,MOTRIN) 200 MG tablet Take  200-400 mg by mouth every 6 (six) hours as needed. For pain.     [provider]  ?levonorgestrel (MIRENA) 20 MCG/24HR IUD 1 each by Intrauterine route once.     [provider]  ? ? ?Family History ?Family History  ?Problem Relation Age of Onset  ? Depression Mother   ? Bipolar disorder Mother   ? Heart attack Father   ?     mid 71s  ? Hypertension Father   ? Suicidality Paternal Uncle   ? Heart disease Paternal Uncle   ? Cancer Maternal Grandmother 23  ?     breast  ? Cancer Paternal Grandmother   ?     colon  ? Hypertension Paternal Grandfather   ? ? ?Social History ?Social History  ? ?Tobacco Use  ? Smoking status: Former  ?  Types: Cigarettes  ? Smokeless tobacco: Never  ?Vaping Use  ? Vaping Use: Never used  ?Substance Use Topics  ? Alcohol use: Yes  ?  Alcohol/week: 6.0 standard drinks  ?  Types: 6 Glasses of wine per week  ?  Comment: rarely  ? Drug use: Yes  ?  Types: Marijuana  ? ? ? ?Allergies   ?Sulfa antibiotics, Amoxicillin, Penicillins, Septra [bactrim], and Tilactase ? ? ?Review of Systems ?Review of Systems  ?HENT:  Positive for congestion, postnasal drip, rhinorrhea and sinus pressure.   ?  Respiratory:  Positive for cough.   ?All other systems reviewed and are negative. ? ? ?Physical Exam ?Triage Vital Signs ?ED Triage Vitals  ?Enc Vitals Group  ?   BP 12/26/21 1304 118/76  ?   Pulse Rate 12/26/21 1304 77  ?   Resp 12/26/21 1304 18  ?   Temp 12/26/21 1304 98.1 ?F (36.7 ?C)  ?   Temp Source 12/26/21 1304 Oral  ?   SpO2 12/26/21 1304 98 %  ?   Weight --   ?   Height --   ?   Head Circumference --   ?   Peak Flow --   ?   Pain Score 12/26/21 1306 9  ?   Pain Loc --   ?   Pain Edu? --   ?   Excl. in [place]? --   ? ?No data found. ? ?Updated Vital Signs ?BP 118/76 (BP Location: Left Arm)   Pulse 77   Temp 98.1 ?F (36.7 ?C) (Oral)   Resp 18   SpO2 98%  ? ?Visual Acuity ?Right Eye Distance:   ?Left Eye Distance:   ?Bilateral Distance:   ? ?Right Eye Near:   ?Left Eye Near:    ?Bilateral  Near:    ? ?Physical Exam ?Vitals and nursing note reviewed.  ?Constitutional:   ?   General: She is not in acute distress. ?   Appearance: Normal appearance. She is normal weight. She is not ill-appearing, toxic-appearing or diaphoretic.  ?HENT:  ?   Head: Normocephalic and atraumatic.  ?   Right Ear: Tympanic membrane, ear canal and external ear normal. There is no impacted cerumen.  ?   Left Ear: Tympanic membrane, ear canal and external ear normal. There is no impacted cerumen.  ?   Nose: Congestion and rhinorrhea present.  ?   Comments: Very nasally/ congested voice quality ?Minimal sinus pain/ tenderness ?   Mouth/Throat:  ?   Mouth: Mucous membranes are moist.  ?   Pharynx: Oropharynx is clear. No oropharyngeal exudate or posterior oropharyngeal erythema.  ?Eyes:  ?   General: No scleral icterus.    ?   Right eye: No discharge.     ?   Left eye: No discharge.  ?   Extraocular Movements: Extraocular movements intact.  ?   Conjunctiva/sclera: Conjunctivae normal.  ?   Pupils: Pupils are equal, round, and reactive to light.  ?Cardiovascular:  ?   Rate and Rhythm: Normal rate and regular rhythm.  ?   Pulses: Normal pulses.  ?   Heart sounds: Normal heart sounds. No murmur heard. ?  No friction rub.  ?Pulmonary:  ?   Effort: Pulmonary effort is normal. No respiratory distress.  ?   Breath sounds: Normal breath sounds. No stridor. No wheezing, rhonchi or rales.  ?Chest:  ?   Chest wall: No tenderness.  ?Musculoskeletal:  ?   Cervical back: Normal range of motion and neck supple. No rigidity or tenderness.  ?Lymphadenopathy:  ?   Cervical: No cervical adenopathy.  ?Skin: ?   General: Skin is warm.  ?   Capillary Refill: Capillary refill takes less than 2 seconds.  ?   Coloration: Skin is not jaundiced.  ?   Findings: No erythema or rash.  ?Neurological:  ?   General: No focal deficit present.  ?   Mental Status: She is alert and oriented to person, place, and time.  ?Psychiatric:     ?   Mood and  Affect: Mood  normal.  ? ? ? ?UC Treatments / Results  ?Labs ?(all labs ordered are listed, but only abnormal results are displayed) ?Labs Reviewed - No data to display ? ?EKG ? ? ?Radiology ?No results found. ? ?Procedures ?Procedures (including critical care time) ? ?Medications Ordered in UC ?Medications - No data to display ? ?Initial Impression / Assessment and Plan / UC Course  ?I have reviewed the triage vital signs and the nursing notes. ? ?Pertinent labs & imaging results that were available during my care of the patient were reviewed by me and considered in my medical decision making (see chart for details). ? ?  ? ?Acute viral sinusitis - likely triggered by untreated seasonal allergies. Will do short burst of prednisone to help with the nasal inflammation and voice change in combination with flonase. Recommend adding montelukast in addition to claritin. Continue saline washes and humidification. ? ?Final Clinical Impressions(s) / UC Diagnoses  ? ?Final diagnoses:  ?Acute viral sinusitis  ? ? ? ?Discharge Instructions   ? ?  ?Your symptoms today are likely virally or allergically mediated. ?Purchase a warm mist vaporizer for use at nighttime, or use steam from a shower. ?Plain guaifenesin or Mucinex may be beneficial to help loosen the phlegm. ?Please start using the nasal spray prescribed today, 1-2 times daily per nostril.  Stop use if nasal bleeding occurs ?Start taking the prednisone as prescribed, best taken in the morning to prevent insomnia at night.  ?Take the montelukast at night as it can make you sleepy. ?Monitor for fever, severe pain in sinuses, or any worsening symptoms ? ? ? ? ?ED Prescriptions   ? ? Medication Sig Dispense Auth. Provider  ? predniSONE (DELTASONE) 50 MG tablet Take 1 tablet (50 mg total) by mouth daily with breakfast for 3 days. 3 tablet Mally Gavina L, PA  ? fluticasone (FLONASE) 50 MCG/ACT nasal spray Place 1 spray into both nostrils daily. 16 mL Ardean Melroy L, PA  ? montelukast  (SINGULAIR) 10 MG tablet Take 1 tablet (10 mg total) by mouth at bedtime. 10 tablet [place], [place] L, PA  ? ?  ? ?PDMP not reviewed this encounter. ?  Chaney Malling, [place] ?12/26/21 1424 ? ?

## 2024-02-25 ENCOUNTER — Ambulatory Visit
Admission: RE | Admit: 2024-02-25 | Discharge: 2024-02-25 | Disposition: A | Discharge status: Home / Self Care | Payer: Self-pay | Source: Ambulatory Visit | Attending: Physician Assistant | Admitting: Physician Assistant

## 2024-02-25 DIAGNOSIS — N898 Other specified noninflammatory disorders of vagina: Secondary | ICD-10-CM

## 2024-02-25 NOTE — ED Triage Notes (Signed)
 Pt reports vaginal discharge with odor for a month. States she has been treated before and it came back. States she had weird side effects last time so took flagyl pills

## 2024-02-25 NOTE — ED Provider Notes (Signed)
 [facility]    CSN: [id number] Arrival date & time: 02/25/24  1544      History   Chief Complaint Chief Complaint  Patient presents with   Vaginal Discharge    Suspected BV - Entered by patient    HPI Bailey [name] is a 33 y.o. female.   Patient presents today for evaluation of vaginal discharge that she has had with odor over a month.  She reports she has been treated for BV in the past but symptoms returned.  She does note that she has had odd side effects of Flagyl oral in the past.  She denies any known STD exposures.  The history is provided by the patient.  Vaginal Discharge Associated symptoms: no abdominal pain, no dysuria, no fever, no nausea and no vomiting     Past Medical History:  Diagnosis Date   BV (bacterial vaginosis)    IBS (irritable bowel syndrome)    Scoliosis    TB lung, latent    Vaginal yeast infection     Patient Active Problem List   Diagnosis Date Noted   History of positive PCR for herpes simplex virus type 1 (HSV-1) DNA 02/15/2020   Sexual dysfunction 06/27/2019   GAD (generalized anxiety disorder) 02/17/2019   Arthralgia of right temporomandibular joint 09/14/2017   Healthcare maintenance 09/14/2017    Past Surgical History:  Procedure Laterality Date   FOOT SURGERY Left    tooth pick removed from bottom of foot    OB History   No obstetric history on file.      Home Medications    Prior to Admission medications   Medication Sig Start Date End Date Taking? Authorizing Provider  norgestimate-ethinyl estradiol (ORTHO-CYCLEN) 0.25-35 MG-MCG tablet Take 1 tablet by mouth daily. 12/28/23  Yes [provider]  fluticasone  (FLONASE ) 50 MCG/ACT nasal spray Place 1 spray into both nostrils daily. 12/26/21   Crain, Whitney L, PA  ibuprofen  (ADVIL ,MOTRIN ) 200 MG tablet Take 200-400 mg by mouth every 6 (six) hours as needed. For pain.     [provider]  levonorgestrel (MIRENA) 20 MCG/24HR IUD 1 each by  Intrauterine route once.  Patient not taking: Reported on 02/25/2024    [provider]  montelukast  (SINGULAIR ) 10 MG tablet Take 1 tablet (10 mg total) by mouth at bedtime. 12/26/21   Mandy Second, PA    Family History Family History  Problem Relation Age of Onset   Depression Mother    Bipolar disorder Mother    Heart attack Father        mid 17s   Hypertension Father    Suicidality Paternal Uncle    Heart disease Paternal Uncle    Cancer Maternal Grandmother 30       breast   Cancer Paternal Grandmother        colon   Hypertension Paternal Grandfather     Social History Social History   Tobacco Use   Smoking status: Former    Types: Cigarettes   Smokeless tobacco: Never  Vaping Use   Vaping status: Never Used  Substance Use Topics   Alcohol use: Yes    Alcohol/week: 6.0 standard drinks of alcohol    Types: 6 Glasses of wine per week    Comment: rarely   Drug use: Not Currently    Types: Marijuana     Allergies   Sulfa antibiotics, Amoxicillin, Penicillins, Septra [bactrim], Tilactase, and Metronidazole   Review of Systems Review of Systems  Constitutional:  Negative for chills and fever.  Eyes:  Negative for discharge and redness.  Gastrointestinal:  Negative for abdominal pain, nausea and vomiting.  Genitourinary:  Positive for vaginal discharge. Negative for dysuria and genital sores.     Physical Exam Triage Vital Signs ED Triage Vitals [02/25/24 1550]  Encounter Vitals Group     BP      Girls Systolic BP Percentile      Girls Diastolic BP Percentile      Boys Systolic BP Percentile      Boys Diastolic BP Percentile      Pulse      Resp      Temp      Temp src      SpO2      Weight      Height      Head Circumference      Peak Flow      Pain Score 0     Pain Loc      Pain Education      Exclude from Growth Chart    No data found.  Updated Vital Signs LMP 02/21/2024 (Approximate)   Visual Acuity Right Eye Distance:    Left Eye Distance:   Bilateral Distance:    Right Eye Near:   Left Eye Near:    Bilateral Near:     Physical Exam Vitals and nursing note reviewed.  Constitutional:      General: She is not in acute distress.    Appearance: Normal appearance. She is not ill-appearing.  HENT:     Head: Normocephalic and atraumatic.   Eyes:     Conjunctiva/sclera: Conjunctivae normal.    Cardiovascular:     Rate and Rhythm: Normal rate.  Pulmonary:     Effort: Pulmonary effort is normal. No respiratory distress.   Neurological:     Mental Status: She is alert.   Psychiatric:        Mood and Affect: Mood normal.        Behavior: Behavior normal.        Thought Content: Thought content normal.      UC Treatments / Results  Labs (all labs ordered are listed, but only abnormal results are displayed) Labs Reviewed  CERVICOVAGINAL ANCILLARY ONLY    EKG   Radiology No results found.  Procedures Procedures (including critical care time)  Medications Ordered in UC Medications - No data to display  Initial Impression / Assessment and Plan / UC Course  I have reviewed the triage vital signs and the nursing notes.  Pertinent labs & imaging results that were available during my care of the patient were reviewed by me and considered in my medical decision making (see chart for details).    Screening ordered for BV, yeast, gonorrhea, chlamydia and trichomonas.  Discussed Flagyl intolerance and recommended [facility] should BV screening return positive.  Will await results for further treatment recommendation.  Patient is agreeable with plan.  Final Clinical Impressions(s) / UC Diagnoses   Final diagnoses:  Vaginal discharge   Discharge Instructions   None    ED Prescriptions   None    PDMP not reviewed this encounter.   Vernestine Gondola, PA-C 02/25/24 [phone]

## 2024-02-29 ENCOUNTER — Ambulatory Visit (HOSPITAL_COMMUNITY): Payer: Self-pay

## 2024-02-29 LAB — CERVICOVAGINAL ANCILLARY ONLY
Bacterial Vaginitis (gardnerella): POSITIVE — AB
Candida Glabrata: NEGATIVE
Candida Vaginitis: NEGATIVE
Chlamydia: NEGATIVE
Comment: NEGATIVE
Comment: NEGATIVE
Comment: NEGATIVE
Comment: NEGATIVE
Comment: NEGATIVE
Comment: NORMAL
Neisseria Gonorrhea: NEGATIVE
Trichomonas: NEGATIVE

## 2024-02-29 MED ORDER — METRONIDAZOLE 0.75 % VA GEL: 1.0000 | Freq: Every day | VAGINAL | 0 refills | Status: AC
